# Patient Record
Sex: Female | Born: 1970 | Race: White | Hispanic: No | Marital: Married | Smoking: Current every day smoker
Health system: Southern US, Community
[De-identification: ages and names within clinical notes are randomized; demographics above are authoritative.]

## PROBLEM LIST (undated history)

## (undated) DIAGNOSIS — G35D Multiple sclerosis, unspecified: Secondary | ICD-10-CM

## (undated) DIAGNOSIS — R809 Proteinuria, unspecified: Secondary | ICD-10-CM

## (undated) DIAGNOSIS — M1990 Unspecified osteoarthritis, unspecified site: Secondary | ICD-10-CM

## (undated) DIAGNOSIS — J189 Pneumonia, unspecified organism: Secondary | ICD-10-CM

## (undated) DIAGNOSIS — G250 Essential tremor: Secondary | ICD-10-CM

## (undated) DIAGNOSIS — O24419 Gestational diabetes mellitus in pregnancy, unspecified control: Secondary | ICD-10-CM

## (undated) DIAGNOSIS — I10 Essential (primary) hypertension: Secondary | ICD-10-CM

## (undated) DIAGNOSIS — E049 Nontoxic goiter, unspecified: Secondary | ICD-10-CM

## (undated) DIAGNOSIS — G35 Multiple sclerosis: Secondary | ICD-10-CM

## (undated) DIAGNOSIS — Z8489 Family history of other specified conditions: Secondary | ICD-10-CM

## (undated) DIAGNOSIS — IMO0001 Reserved for inherently not codable concepts without codable children: Secondary | ICD-10-CM

## (undated) HISTORY — DX: Multiple sclerosis, unspecified: G35.D

## (undated) HISTORY — DX: Reserved for inherently not codable concepts without codable children: IMO0001

## (undated) HISTORY — PX: WISDOM TOOTH EXTRACTION: SHX21

## (undated) HISTORY — DX: Gestational diabetes mellitus in pregnancy, unspecified control: O24.419

## (undated) HISTORY — DX: Essential (primary) hypertension: I10

## (undated) HISTORY — DX: Unspecified osteoarthritis, unspecified site: M19.90

## (undated) HISTORY — DX: Essential tremor: G25.0

## (undated) HISTORY — DX: Multiple sclerosis: G35

## (undated) HISTORY — DX: Proteinuria, unspecified: R80.9

## (undated) HISTORY — PX: COLONOSCOPY: SHX174

---
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
## 1989-01-29 HISTORY — PX: TONSILLECTOMY: SHX5217

## 2012-08-05 ENCOUNTER — Ambulatory Visit (INDEPENDENT_AMBULATORY_CARE_PROVIDER_SITE_OTHER): Payer: BC Managed Care – PPO | Admitting: Family Medicine

## 2012-08-05 ENCOUNTER — Encounter: Payer: Self-pay | Admitting: Family Medicine

## 2012-08-05 VITALS — BP 120/78 | HR 102 | Temp 98.5°F | Wt 209.8 lb

## 2012-08-05 DIAGNOSIS — R350 Frequency of micturition: Secondary | ICD-10-CM

## 2012-08-05 LAB — POCT URINALYSIS DIPSTICK
Bilirubin, UA: NEGATIVE
Nitrite, UA: NEGATIVE
pH, UA: 6

## 2012-08-05 MED ORDER — NITROFURANTOIN MONOHYD MACRO 100 MG PO CAPS: 100.0000 mg | ORAL_CAPSULE | Freq: Two times a day (BID) | ORAL | Status: DC

## 2012-08-05 NOTE — Patient Instructions (Signed)
Urinary Tract Infection  Urinary tract infections (UTIs) can develop anywhere along your urinary tract. Your urinary tract is your body's drainage system for removing wastes and extra water. Your urinary tract includes two kidneys, two ureters, a bladder, and a urethra. Your kidneys are a pair of bean-shaped organs. Each kidney is about the size of your fist. They are located below your ribs, one on each side of your spine.  CAUSES  Infections are caused by microbes, which are microscopic organisms, including fungi, viruses, and bacteria. These organisms are so small that they can only be seen through a microscope. Bacteria are the microbes that most commonly cause UTIs.  SYMPTOMS   Symptoms of UTIs may vary by age and gender of the patient and by the location of the infection. Symptoms in young women typically include a frequent and intense urge to urinate and a painful, burning feeling in the bladder or urethra during urination. Older women and men are more likely to be tired, shaky, and weak and have muscle aches and abdominal pain. A fever may mean the infection is in your kidneys. Other symptoms of a kidney infection include pain in your back or sides below the ribs, nausea, and vomiting.  DIAGNOSIS  To diagnose a UTI, your caregiver will ask you about your symptoms. Your caregiver also will ask to provide a urine sample. The urine sample will be tested for bacteria and white blood cells. White blood cells are made by your body to help fight infection.  TREATMENT   Typically, UTIs can be treated with medication. Because most UTIs are caused by a bacterial infection, they usually can be treated with the use of antibiotics. The choice of antibiotic and length of treatment depend on your symptoms and the type of bacteria causing your infection.  HOME CARE INSTRUCTIONS   If you were prescribed antibiotics, take them exactly as your caregiver instructs you. Finish the medication even if you feel better after you  have only taken some of the medication.   Drink enough water and fluids to keep your urine clear or pale yellow.   Avoid caffeine, tea, and carbonated beverages. They tend to irritate your bladder.   Empty your bladder often. Avoid holding urine for long periods of time.   Empty your bladder before and after sexual intercourse.   After a bowel movement, women should cleanse from front to back. Use each tissue only once.  SEEK MEDICAL CARE IF:    You have back pain.   You develop a fever.   Your symptoms do not begin to resolve within 3 days.  SEEK IMMEDIATE MEDICAL CARE IF:    You have severe back pain or lower abdominal pain.   You develop chills.   You have nausea or vomiting.   You have continued burning or discomfort with urination.  MAKE SURE YOU:    Understand these instructions.   Will watch your condition.   Will get help right away if you are not doing well or get worse.  Document Released: 10/25/2004 Document Revised: 07/17/2011 Document Reviewed: 02/23/2011  ExitCare Patient Information 2014 [employer].

## 2012-08-05 NOTE — Progress Notes (Signed)
  Subjective:    Maria Graves is a 42 y.o. female who complains of burning with urination, frequency, hematuria and bladder spasms. She has had symptoms for 5 weeks. Patient also complains of back pain--- temp 99. Patient denies congestion, cough, fever, headache, rhinitis, sorethroat, stomach ache and vaginal discharge. Patient does not have a history of recurrent UTI. Patient does not have a history of pyelonephritis.   The following portions of the patient's history were reviewed and updated as appropriate: allergies, current medications, past family history, past medical history, past social history, past surgical history and problem list.  Review of Systems Pertinent items are noted in HPI.    Objective:    BP 120/78  Pulse 102  Temp(Src) 98.5 F (36.9 C) (Oral)  Wt 209 lb 12.8 oz (95.165 kg)  SpO2 96% General appearance: alert, cooperative, appears stated age and no distress Abdomen: soft, non-tender; bowel sounds normal; no masses,  no organomegaly  Laboratory:  Urine dipstick: mod for hemoglobin, 2+ for leukocyte esterase and trace for protein.   Micro exam: not done.    Assessment:    UTI     Plan:    Medications: nitrofurantoin. Maintain adequate hydration. Follow up if symptoms not improving, and as needed.

## 2012-08-06 ENCOUNTER — Ambulatory Visit: Payer: Self-pay | Admitting: Family Medicine

## 2012-08-07 ENCOUNTER — Encounter: Payer: Self-pay | Admitting: Family Medicine

## 2012-08-07 ENCOUNTER — Telehealth: Payer: Self-pay | Admitting: Internal Medicine

## 2012-08-07 ENCOUNTER — Telehealth: Payer: Self-pay | Admitting: Family Medicine

## 2012-08-07 ENCOUNTER — Ambulatory Visit (INDEPENDENT_AMBULATORY_CARE_PROVIDER_SITE_OTHER): Payer: BC Managed Care – PPO | Admitting: Family Medicine

## 2012-08-07 ENCOUNTER — Ambulatory Visit: Payer: Self-pay | Admitting: Family Medicine

## 2012-08-07 VITALS — BP 120/90 | HR 109 | Temp 98.6°F | Wt 200.6 lb

## 2012-08-07 DIAGNOSIS — N10 Acute tubulo-interstitial nephritis: Secondary | ICD-10-CM

## 2012-08-07 DIAGNOSIS — R3915 Urgency of urination: Secondary | ICD-10-CM

## 2012-08-07 LAB — POCT URINALYSIS DIPSTICK
Protein, UA: NEGATIVE
pH, UA: 6

## 2012-08-07 LAB — URINE CULTURE

## 2012-08-07 MED ORDER — CIPROFLOXACIN HCL 500 MG PO TABS: 500.0000 mg | ORAL_TABLET | Freq: Two times a day (BID) | ORAL | Status: DC

## 2012-08-07 NOTE — Telephone Encounter (Signed)
Patient was seen in the office 08/07/2012 by Dr. Beverely Low. Issues addressed. GF/RN

## 2012-08-07 NOTE — Telephone Encounter (Signed)
Patient Information:  Caller Name: Julie-Anne  Phone: [phone]  Patient: Maria Graves, Maria Graves  Gender: Female  DOB: [birth year]  Age: 42 Years  PCP: Lelon Perla.  Pregnant: No  Office Follow Up:  Does the office need to follow up with this patient?: Yes  Instructions For The Office: PLS DISCUSS W/ DR LOWNE IF PT CAN BE SEEN EARLIER THAN 1530, APPT SCHEDULED, DUE TO PTS SYMPTOMS NOT RESPONDING TO MACROBID.  RN Note:  Pt was seen on 7-8 for UTI, Macrobid given, Pt has chills w/ flu like symptoms, dizziness and SOB.  All emergent symptoms ruled out per Urinary female protocol in [facility], see in 4 hrs due to fever in immunocompromised person.  Pt has history of MS.  Appt scheduled at 1530 on 7-10 w/ Dr Laury Axon.  Symptoms  Reason For Call & Symptoms: ER CALL. F/U UTI symptoms  Reviewed Health History In EMR: N/A  Reviewed Medications In EMR: N/A  Reviewed Allergies In EMR: N/A  Reviewed Surgeries / Procedures: N/A  Date of Onset of Symptoms: 08/07/2012  Any Fever: Yes  Fever Taken: Tactile  Fever Time Of Reading: 09:38:39  Fever Last Reading: N/A OB / GYN:  LMP: Unknown  Guideline(s) Used:  No Protocol Available - Sick Adult  Disposition Per Guideline:   Go to Office Now  Reason For Disposition Reached:   Nursing judgment  Advice Given:  N/A  Patient Will Follow Care Advice:  YES  Appointment Scheduled:  08/07/2012 15:30:00 Appointment Scheduled Provider:  Lelon Perla.

## 2012-08-07 NOTE — Telephone Encounter (Signed)
Pt scheduled for 11:15am °

## 2012-08-07 NOTE — Progress Notes (Signed)
  Subjective:    Patient ID: Maria Graves, female    DOB: 03-07-1970, 42 y.o.   MRN: [id number]  HPI UTI- pt seen 2 days ago and started on Macrobid for UTI.  Was treated in June for UTI w/ Cipro by UC.  2 weeks ago had urinary retention x24 hrs and went to ER and was started on Doxy for another UTI- no cath to relieve pressure.  Moved here this week from [facility] and sxs returned this week.  1 hr after 1st dose of macrobid developed chills, sweats, dizzy, body aches, 'flu like sxs', + CVA tenderness.  No known sick contacts.   Review of Systems For ROS see HPI     Objective:   Physical Exam  Vitals reviewed. Constitutional: She is oriented to person, place, and time. She appears well-developed and well-nourished. No distress.  Cardiovascular: Normal rate, regular rhythm and normal heart sounds.   Pulmonary/Chest: Effort normal and breath sounds normal. No respiratory distress. She has no wheezes. She has no rales.  Abdominal: Soft. There is tenderness (suprapubic and CVA tenderness).  Neurological: She is alert and oriented to person, place, and time.          Assessment & Plan:

## 2012-08-07 NOTE — Assessment & Plan Note (Addendum)
New.  Pt now w/ systemic sxs and prelim culture shows >100,000 E Coli.  Stop Macrobid.  Switch to Cipro.  Encouraged fluids.  Refer to urology as this is pt's 3rd UTI and now Pyelo in 6 weeks.  Reviewed supportive care and red flags that should prompt return.  Pt expressed understanding and is in agreement w/ plan.

## 2012-08-07 NOTE — Telephone Encounter (Signed)
Patient states that the antibiotic she was given on Tuesday at her appointment: Macrobid is causing her to be shaky and very thirsty. States "I am now drinking water like it is going out of style." Wants to know what she should do. Please advise.

## 2012-08-07 NOTE — Patient Instructions (Addendum)
Start the Cipro twice daily Drink plenty of fluids REST! Alternate tylenol and ibuprofen for fever and body aches We'll call you with your urology appt Call with any questions or concerns Hang in there!!!

## 2012-09-24 ENCOUNTER — Ambulatory Visit: Payer: Self-pay | Admitting: Internal Medicine

## 2012-10-01 ENCOUNTER — Ambulatory Visit (INDEPENDENT_AMBULATORY_CARE_PROVIDER_SITE_OTHER): Payer: BC Managed Care – PPO | Admitting: Internal Medicine

## 2012-10-01 ENCOUNTER — Encounter: Payer: Self-pay | Admitting: Internal Medicine

## 2012-10-01 VITALS — BP 132/84 | HR 84 | Temp 98.2°F | Ht 67.4 in | Wt 206.0 lb

## 2012-10-01 DIAGNOSIS — N10 Acute tubulo-interstitial nephritis: Secondary | ICD-10-CM

## 2012-10-01 DIAGNOSIS — R911 Solitary pulmonary nodule: Secondary | ICD-10-CM | POA: Insufficient documentation

## 2012-10-01 DIAGNOSIS — Z0000 Encounter for general adult medical examination without abnormal findings: Secondary | ICD-10-CM | POA: Insufficient documentation

## 2012-10-01 DIAGNOSIS — I10 Essential (primary) hypertension: Secondary | ICD-10-CM | POA: Insufficient documentation

## 2012-10-01 DIAGNOSIS — G35 Multiple sclerosis: Secondary | ICD-10-CM

## 2012-10-01 LAB — BASIC METABOLIC PANEL
BUN: 11 mg/dL (ref 6–23)
CO2: 26 mEq/L (ref 19–32)
Chloride: 103 mEq/L (ref 96–112)
Creatinine, Ser: 0.7 mg/dL (ref 0.4–1.2)
Potassium: 3.5 mEq/L (ref 3.5–5.1)

## 2012-10-01 MED ORDER — BACLOFEN 10 MG PO TABS: 10.0000 mg | ORAL_TABLET | Freq: Two times a day (BID) | ORAL | Status: DC | PRN

## 2012-10-01 MED ORDER — LISINOPRIL 10 MG PO TABS: 10.0000 mg | ORAL_TABLET | Freq: Every day | ORAL | Status: DC

## 2012-10-01 NOTE — Patient Instructions (Addendum)
Next visit in 5 months  for a physical exam  Please make an appointment before you leave the office today (or call few weeks in advance) --- Get records from previous MD: Most recent labs, x-rays, EKGs, Pap smears, colonoscopy if available, neurology notes.

## 2012-10-01 NOTE — Assessment & Plan Note (Signed)
Patient is concerned about recent UTI. The patient denies history of recurrent UTIs, CT of the abdomen and pelvis showed a normal urinary tract. She did have a single episode of urinary retention but is now asymptomatic, urology did not recommend further eval at this time. For now I recommend observation, if she develops more UTIs, she will need further eval including urodynamics.

## 2012-10-01 NOTE — Assessment & Plan Note (Signed)
42 year old lady with no family history of lung cancer, former heavy smoker with 5 mm LLL nodule. I explained the patient that this is a very common finding and the right thing to do is repeat a CT, we agreed to do that in 6 months

## 2012-10-01 NOTE — Assessment & Plan Note (Signed)
Needs a referral to a local  gynecologist, we'll arrange

## 2012-10-01 NOTE — Assessment & Plan Note (Signed)
Needs to establish with a new neurologist, will arrange. Refill baclofen

## 2012-10-01 NOTE — Assessment & Plan Note (Signed)
Seems well controlled. Refill her lisinopril, labs

## 2012-10-01 NOTE — Progress Notes (Signed)
Subjective:    Patient ID: Maria Graves, female    DOB: 19-Feb-1970, 42 y.o.   MRN: [id number]  HPI New patient to me, likes to discuss her recent UTI and CT results.  Several weeks ago while in [place], she was diagnosed with UTI yeast infection, she was prescribed Cipro. She did have a single episode of urinary retention for a day or 2 and ended up in the emergency room. Subsequently she moved to [facility], she was diagnosed with a UTI via a urine culture here in our office and was prescribed Macrobid. She did not respond to macrobid , was switched to Cipro and eventually referred  to urology. Urology saw her,Rx a  CT of the abdomen and pelvis which was essentially negative except for a 5 mm LLL nodule. They believe that she felt unwell after Macrobid only because the infection did not respond to the antibiotic not because a s/e. They don't think the single episode of urinary retention was something that needed further eval. The repeated a urine culture and it came back negative.  Past Medical History  Diagnosis Date  . MS (multiple sclerosis)   . Gestational diabetes   . HTN (hypertension)   . Essential tremor    Past Surgical History  Procedure Laterality Date  . Tonsillectomy  1991   History   Social History  . Marital Status: Married    Spouse Name: N/A    Number of Children: 2  . Years of Education: N/A   Occupational History  . 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 Smoking status: Former Games developer  . Smokeless tobacco: Never Used     Comment: 1 ppd x 20 year, quit 2010  . Alcohol Use: Yes     Comment: Socially  . Drug Use: No  . Sexual Activity: Not on file   Other Topics Concern  . Not on file   Social History Narrative   Moved from [place] July 2014   G3P2   Family History  Problem Relation Age of Onset  . Arthritis Mother   . Alcohol abuse Mother   . Hypertension Brother   . Heart disease Paternal Grandfather   . Stroke Paternal  Grandmother   . Diabetes Father     Borderline  . Diabetes Maternal Grandfather   . Sudden death Other     uncle, GF  . Heart attack Paternal Grandfather   . Clotting disorder Father      Review of Systems At this point she is feeling well. No fever or chills No Abdominal pain, nausea, vomiting, diarrhea. No cough, chest congestion, chest pain. No weight loss or hemoptysis.    Objective:   Physical Exam BP 132/84  Pulse 84  Temp(Src) 98.2 F (36.8 C)  Ht 5' 7.4" (1.712 m)  Wt 206 lb (93.441 kg)  BMI 31.88 kg/m2  SpO2 97%  General -- alert, well-developed, NAD.  Neck --no thyromegaly , no LAD  Lungs -- normal respiratory effort, no intercostal retractions, no accessory muscle use, and normal breath sounds.  Heart-- normal rate, regular rhythm, no murmur.  Abdomen-- Not distended, good bowel sounds,soft, non-tender. No CVA tenderness   Extremities-- no pretibial edema bilaterally  Neurologic-- alert & oriented X3. Speech, gait normal. Psych-- Cognition and judgment appear intact. Alert and cooperative with normal attention span and concentration. not anxious appearing and not depressed appearing.      Assessment & Plan:

## 2012-10-02 ENCOUNTER — Encounter: Payer: Self-pay | Admitting: Internal Medicine

## 2012-10-03 ENCOUNTER — Encounter: Payer: Self-pay | Admitting: *Deleted

## 2012-10-22 ENCOUNTER — Other Ambulatory Visit: Payer: Self-pay

## 2012-10-22 DIAGNOSIS — Z1231 Encounter for screening mammogram for malignant neoplasm of breast: Secondary | ICD-10-CM

## 2012-10-29 LAB — HM PAP SMEAR

## 2012-11-11 ENCOUNTER — Ambulatory Visit
Admission: RE | Admit: 2012-11-11 | Discharge: 2012-11-11 | Disposition: A | Discharge status: Home / Self Care | Payer: BC Managed Care – PPO | Source: Ambulatory Visit

## 2012-11-11 DIAGNOSIS — Z1231 Encounter for screening mammogram for malignant neoplasm of breast: Secondary | ICD-10-CM

## 2013-03-06 ENCOUNTER — Telehealth: Payer: Self-pay

## 2013-03-09 NOTE — Telephone Encounter (Signed)
Medication and allergies:  Reviewed and updated  90 day supply/mail order: na Local pharmacy: [facility]   Immunizations due:  Declines flu vaccine  A/P:   No changes to FH, PSH or Personal Hx Flu vaccine--declines Tdap--reaction to last one given- Pap--2014--nml MMG--2014--nml  To Discuss with Provider: Questions is there is any part of the Tdap that she could receive? Has received in parts before. Still concerned about pulmonary nodule--repeat CT

## 2013-03-10 ENCOUNTER — Ambulatory Visit (INDEPENDENT_AMBULATORY_CARE_PROVIDER_SITE_OTHER): Payer: 59 | Admitting: Internal Medicine

## 2013-03-10 ENCOUNTER — Encounter: Payer: Self-pay | Admitting: Internal Medicine

## 2013-03-10 VITALS — BP 151/94 | HR 87 | Temp 98.0°F | Ht 67.3 in | Wt 219.0 lb

## 2013-03-10 DIAGNOSIS — G35 Multiple sclerosis: Secondary | ICD-10-CM

## 2013-03-10 DIAGNOSIS — Z0000 Encounter for general adult medical examination without abnormal findings: Secondary | ICD-10-CM

## 2013-03-10 DIAGNOSIS — I10 Essential (primary) hypertension: Secondary | ICD-10-CM

## 2013-03-10 LAB — CBC WITH DIFFERENTIAL/PLATELET
Basophils Absolute: 0.1 10*3/uL (ref 0.0–0.1)
Basophils Relative: 0.8 % (ref 0.0–3.0)
EOS PCT: 2.5 % (ref 0.0–5.0)
Eosinophils Absolute: 0.2 10*3/uL (ref 0.0–0.7)
HEMATOCRIT: 43 % (ref 36.0–46.0)
HEMOGLOBIN: 13.9 g/dL (ref 12.0–15.0)
LYMPHS ABS: 2.4 10*3/uL (ref 0.7–4.0)
LYMPHS PCT: 31.4 % (ref 12.0–46.0)
MCHC: 32.3 g/dL (ref 30.0–36.0)
MCV: 86.3 fl (ref 78.0–100.0)
MONOS PCT: 4.8 % (ref 3.0–12.0)
Monocytes Absolute: 0.4 10*3/uL (ref 0.1–1.0)
Neutro Abs: 4.6 10*3/uL (ref 1.4–7.7)
Neutrophils Relative %: 60.5 % (ref 43.0–77.0)
PLATELETS: 267 10*3/uL (ref 150.0–400.0)
RBC: 4.98 Mil/uL (ref 3.87–5.11)
RDW: 14.1 % (ref 11.5–14.6)
WBC: 7.6 10*3/uL (ref 4.5–10.5)

## 2013-03-10 LAB — LIPID PANEL
Cholesterol: 170 mg/dL (ref 0–200)
HDL: 52 mg/dL (ref >39.00)
LDL Cholesterol: 95 mg/dL (ref 0–99)
Total CHOL/HDL Ratio: 3
Triglycerides: 113 mg/dL (ref 0.0–149.0)
VLDL: 22.6 mg/dL (ref 0.0–40.0)

## 2013-03-10 LAB — COMPREHENSIVE METABOLIC PANEL
ALT: 16 U/L (ref 0–35)
AST: 13 U/L (ref 0–37)
Albumin: 4.1 g/dL (ref 3.5–5.2)
Alkaline Phosphatase: 48 U/L (ref 39–117)
BUN: 13 mg/dL (ref 6–23)
CALCIUM: 9 mg/dL (ref 8.4–10.5)
CHLORIDE: 104 meq/L (ref 96–112)
CO2: 27 meq/L (ref 19–32)
Creatinine, Ser: 0.7 mg/dL (ref 0.4–1.2)
GFR: 96.2 mL/min (ref >60.00)
Glucose, Bld: 95 mg/dL (ref 70–99)
Potassium: 3.6 mEq/L (ref 3.5–5.1)
Sodium: 139 mEq/L (ref 135–145)
Total Bilirubin: 0.5 mg/dL (ref 0.3–1.2)
Total Protein: 7.4 g/dL (ref 6.0–8.3)

## 2013-03-10 LAB — TSH: TSH: 1.2 u[IU]/mL (ref 0.35–5.50)

## 2013-03-10 NOTE — Assessment & Plan Note (Signed)
Good compliance with lisinopril, BP today slightly elevated, no  ambulatory BPs. Plan: Monitor BPs

## 2013-03-10 NOTE — Progress Notes (Signed)
   Subjective:    Patient ID: Maria Graves, female    DOB: 1970/12/30, 43 y.o.   MRN: [id number]  DOS:  03/10/2013 CPX    Past Medical History  Diagnosis Date  . MS (multiple sclerosis)   . Gestational diabetes   . HTN (hypertension)   . Essential tremor   . Contraception     husband vasectomy    Past Surgical History  Procedure Laterality Date  . Tonsillectomy  1991    History   Social History  . Marital Status: Married    Spouse Name: N/A    Number of Children: 2  . Years of Education: N/A   Occupational History  . 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 Smoking status: Former Games developer  . Smokeless tobacco: Never Used     Comment: 1 ppd x 20 year, quit 2010  . Alcohol Use: Yes     Comment: Socially  . Drug Use: No  . Sexual Activity: Not on file   Other Topics Concern  . Not on file   Social History Narrative   Moved from [place] July 2014   G3P2   Family History  Problem Relation Age of Onset  . Arthritis Mother   . Alcohol abuse Mother   . Hypertension Brother   . Heart disease Paternal Grandfather   . Stroke Paternal Grandmother   . Diabetes Father     Borderline  . Diabetes Maternal Grandfather   . Sudden death Other     uncle, GF  . Heart attack Paternal Grandfather   . Clotting disorder Father   . Colon cancer Neg Hx   . Breast cancer Neg Hx      ROS Diet-- healthy most of the time  Exercise-- 30 min x 5/week No  CP, SOB  Occasionally has pain to touch @ L  flank x 2 years Denies  nausea, vomiting diarrhea Denies  blood in the stools No GERD  Sx. occ chokes w/ fluids, several times a week occ odynophagia No dysuria, gross hematuria, difficulty urinating   No anxiety, depression      Objective:   Physical Exam BP 151/94  Pulse 87  Temp(Src) 98 F (36.7 C)  Ht 5' 7.3" (1.709 m)  Wt 219 lb (99.338 kg)  BMI 34.01 kg/m2  SpO2 97% General -- alert, well-developed, NAD.  Neck --no thyromegaly ,  normal carotid pulse   Lungs -- normal respiratory effort, no intercostal retractions, no accessory muscle use, and normal breath sounds.  Heart-- normal rate, regular rhythm, no murmur.  Abdomen-- Not distended, good bowel sounds,soft, non-tender. Back-- slt TTP at the mid-L back Extremities-- no pretibial edema bilaterally  Neurologic--  alert & oriented X3. Speech normal, gait normal, strength normal in all extremities.  Psych-- Cognition and judgment appear intact. Cooperative with normal attention span and concentration. No anxious or depressed appearing.      Assessment & Plan:

## 2013-03-10 NOTE — Assessment & Plan Note (Addendum)
Td -- intolerant , ++ swelling  Flu vaccine--declines   Female care: Pap--2014--nml  MMG--2014--nml Was told at some point she had goiter? Exam today (-): observation Never had a cscope  Doing well with diet and exercise Labs, EKG nsr

## 2013-03-10 NOTE — Assessment & Plan Note (Signed)
Occasional choking when she drinks fluids, related to MS? Otherwise she is doing great. Likes to see a MS specialist, will refer to [facility]

## 2013-03-10 NOTE — Progress Notes (Signed)
Pre visit review using our clinic review tool, if applicable. No additional management support is needed unless otherwise documented below in the visit note. 

## 2013-03-10 NOTE — Patient Instructions (Signed)
Get your blood work before you leave   Next visit is for a physical exam in 1 year, fasting (as long as your BP is ok) Please make an appointment    Check the  blood pressure 2 or 3 times a month   be sure it is between 110/60 and 140/85. Ideal blood pressure is 120/80. If it is consistently higher or lower, let me know

## 2013-03-11 ENCOUNTER — Telehealth: Payer: Self-pay | Admitting: Internal Medicine

## 2013-03-11 NOTE — Telephone Encounter (Signed)
Relevant patient education assigned to patient using Emmi. ° °

## 2013-03-12 ENCOUNTER — Encounter: Payer: Self-pay | Admitting: *Deleted

## 2013-04-26 ENCOUNTER — Other Ambulatory Visit: Payer: 59 | Admitting: Internal Medicine

## 2013-04-26 DIAGNOSIS — R911 Solitary pulmonary nodule: Secondary | ICD-10-CM

## 2013-04-30 ENCOUNTER — Other Ambulatory Visit (HOSPITAL_BASED_OUTPATIENT_CLINIC_OR_DEPARTMENT_OTHER): Payer: 59

## 2013-05-13 ENCOUNTER — Ambulatory Visit (HOSPITAL_BASED_OUTPATIENT_CLINIC_OR_DEPARTMENT_OTHER)
Admission: RE | Admit: 2013-05-13 | Discharge: 2013-05-13 | Disposition: A | Discharge status: Home / Self Care | Payer: 59 | Source: Ambulatory Visit | Attending: Internal Medicine | Admitting: Internal Medicine

## 2013-05-13 DIAGNOSIS — R911 Solitary pulmonary nodule: Secondary | ICD-10-CM | POA: Insufficient documentation

## 2013-06-16 ENCOUNTER — Telehealth: Payer: Self-pay | Admitting: Internal Medicine

## 2013-06-16 NOTE — Telephone Encounter (Signed)
Caller name:Kambrey Atienza Relation to JY:NWGNFAOpt:patient Call back number: [phone] Pharmacy:  Reason for call: patient called and would like for her recent lab work to be mailed to her home. Please advise.

## 2013-06-16 NOTE — Telephone Encounter (Signed)
Copy of lab results mailed to patient.

## 2013-07-28 ENCOUNTER — Ambulatory Visit (INDEPENDENT_AMBULATORY_CARE_PROVIDER_SITE_OTHER): Payer: 59 | Admitting: Family Medicine

## 2013-07-28 ENCOUNTER — Encounter: Payer: Self-pay | Admitting: Family Medicine

## 2013-07-28 VITALS — BP 132/92 | HR 92 | Temp 97.9°F | Wt 217.0 lb

## 2013-07-28 DIAGNOSIS — R102 Pelvic and perineal pain: Secondary | ICD-10-CM

## 2013-07-28 DIAGNOSIS — N949 Unspecified condition associated with female genital organs and menstrual cycle: Secondary | ICD-10-CM

## 2013-07-28 LAB — POCT URINALYSIS DIPSTICK
Bilirubin, UA: NEGATIVE
Glucose, UA: NEGATIVE
KETONES UA: NEGATIVE
LEUKOCYTES UA: NEGATIVE
NITRITE UA: NEGATIVE
PROTEIN UA: NEGATIVE
Spec Grav, UA: <=1.005
UROBILINOGEN UA: 0.2
pH, UA: 5

## 2013-07-28 MED ORDER — FLUCONAZOLE 150 MG PO TABS: ORAL_TABLET | ORAL | Status: DC

## 2013-07-28 NOTE — Progress Notes (Signed)
   Subjective:    Patient ID: Maria Graves, female    DOB: 02-14-1970, 43 y.o.   MRN: [id number]  HPI Pt here c/o pelvic pain and d/c with frequency.  No odor.  No back pain.    Review of Systems    as above  Objective:   Physical Exam  BP 132/92  Pulse 92  Temp(Src) 97.9 F (36.6 C) (Oral)  Wt 217 lb (98.431 kg)  SpO2 98%  LMP 07/07/2013 General appearance: alert, cooperative, appears stated age and no distress Pelvic-- refused by pt      Assessment & Plan:  1. Pelvic pain in female Pt refused to have pelvic exam. She said she has had d/c several times before and her dr always called in diflucan in past.  We explained that with her urine being neg and having d/c and pelvic pain it was best to do an exam.  She refused and said she had somewhere to be and " wasn't getting into all of that.  " - POCT Urinalysis Dipstick - fluconazole (DIFLUCAN) 150 MG tablet; 1 po qd x 1 , may repeat in 3 days prn  Dispense: 2 tablet; Refill: 0

## 2013-07-28 NOTE — Patient Instructions (Signed)
Pelvic Pain, Female Female pelvic pain can be caused by many different things and start from a variety of places. Pelvic pain refers to pain that is located in the lower half of the abdomen and between your hips. The pain may occur over a short period of time (acute) or may be reoccurring (chronic). The cause of pelvic pain may be related to disorders affecting the female reproductive organs (gynecologic), but it may also be related to the bladder, kidney stones, an intestinal complication, or muscle or skeletal problems. Getting help right away for pelvic pain is important, especially if there has been severe, sharp, or a sudden onset of unusual pain. It is also important to get help right away because some types of pelvic pain can be life threatening.  CAUSES  Below are only some of the causes of pelvic pain. The causes of pelvic pain can be in one of several categories.   Gynecologic.  Pelvic inflammatory disease.  Sexually transmitted infection.  Ovarian cyst or a twisted ovarian ligament (ovarian torsion).  Uterine lining that grows outside the uterus (endometriosis).  Fibroids, cysts, or tumors.  Ovulation.  Pregnancy.  Pregnancy that occurs outside the uterus (ectopic pregnancy).  Miscarriage.  Labor.  Abruption of the placenta or ruptured uterus.  Infection.  Uterine infection (endometritis).  Bladder infection.  Diverticulitis.  Miscarriage related to a uterine infection (septic abortion).  Bladder.  Inflammation of the bladder (cystitis).  Kidney stone(s).  Gastrointenstinal.  Constipation.  Diverticulitis.  Neurologic.  Trauma.  Feeling pelvic pain because of mental or emotional causes (psychosomatic).  Cancers of the bowel or pelvis. EVALUATION  Your caregiver will want to take a careful history of your concerns. This includes recent changes in your health, a careful gynecologic history of your periods (menses), and a sexual history. Obtaining  your family history and medical history is also important. Your caregiver may suggest a pelvic exam. A pelvic exam will help identify the location and severity of the pain. It also helps in the evaluation of which organ system may be involved. In order to identify the cause of the pelvic pain and be properly treated, your caregiver may order tests. These tests may include:   A pregnancy test.  Pelvic ultrasonography.  An X-ray exam of the abdomen.  A urinalysis or evaluation of vaginal discharge.  Blood tests. HOME CARE INSTRUCTIONS   Only take over-the-counter or prescription medicines for pain, discomfort, or fever as directed by your caregiver.   Rest as directed by your caregiver.   Eat a balanced diet.   Drink enough fluids to make your urine clear or pale yellow, or as directed.   Avoid sexual intercourse if it causes pain.   Apply warm or cold compresses to the lower abdomen depending on which one helps the pain.   Avoid stressful situations.   Keep a journal of your pelvic pain. Write down when it started, where the pain is located, and if there are things that seem to be associated with the pain, such as food or your menstrual cycle.  Follow up with your caregiver as directed.  SEEK MEDICAL CARE IF:  Your medicine does not help your pain.  You have abnormal vaginal discharge. SEEK IMMEDIATE MEDICAL CARE IF:   You have heavy bleeding from the vagina.   Your pelvic pain increases.   You feel lightheaded or faint.   You have chills.   You have pain with urination or blood in your urine.   You have uncontrolled   diarrhea or vomiting.   You have a fever or persistent symptoms for more than 3 days.  You have a fever and your symptoms suddenly get worse.   You are being physically or sexually abused.  MAKE SURE YOU:  Understand these instructions.  Will watch your condition.  Will get help if you are not doing well or get worse. Document  Released: 12/13/2003 Document Revised: 07/17/2011 Document Reviewed: 05/07/2011 ExitCare Patient Information 2015 [employer]. This information is not intended to replace advice given to you by your health care provider. Make sure you discuss any questions you have with your health care provider.  

## 2013-07-28 NOTE — Progress Notes (Signed)
Pre visit review using our clinic review tool, if applicable. No additional management support is needed unless otherwise documented below in the visit note. 

## 2013-10-27 ENCOUNTER — Other Ambulatory Visit: Payer: Self-pay

## 2013-10-27 MED ORDER — LISINOPRIL 10 MG PO TABS: 10.0000 mg | ORAL_TABLET | Freq: Every day | ORAL | Status: DC

## 2013-12-25 ENCOUNTER — Other Ambulatory Visit: Payer: Self-pay | Admitting: Internal Medicine

## 2014-02-26 ENCOUNTER — Other Ambulatory Visit: Payer: Self-pay | Admitting: Internal Medicine

## 2014-03-25 ENCOUNTER — Other Ambulatory Visit: Payer: Self-pay | Admitting: Internal Medicine

## 2014-05-22 ENCOUNTER — Telehealth: Payer: Self-pay | Admitting: Internal Medicine

## 2014-05-22 DIAGNOSIS — R911 Solitary pulmonary nodule: Secondary | ICD-10-CM

## 2014-05-22 NOTE — Telephone Encounter (Signed)
Advise pt: Due for repeated CT chest, order entered Due for a pcx, please arrange

## 2014-05-24 ENCOUNTER — Telehealth: Payer: Self-pay | Admitting: Internal Medicine

## 2014-05-24 ENCOUNTER — Other Ambulatory Visit: Payer: Self-pay

## 2014-05-24 NOTE — Telephone Encounter (Signed)
Letter printed and mailed to Pt informing her she is overdue for CPE to call office at her earliest convenience to schedule an appt.

## 2014-05-24 NOTE — Telephone Encounter (Signed)
Relation to pt: self  Call back number: [phone] Pharmacy: [id number] [place], [place] - [address] [phone] (Phone) [phone] (Fax)         Reason for call:  Pt requesting a refill lisinopril (PRINIVIL,ZESTRIL) 10 MG tablet

## 2014-05-24 NOTE — Telephone Encounter (Signed)
Just received refill request from pharmacy. Approved for # 30 tablets until seen later this week.

## 2014-05-25 ENCOUNTER — Other Ambulatory Visit: Payer: Self-pay | Admitting: Internal Medicine

## 2014-05-25 NOTE — Telephone Encounter (Signed)
Medication declined because medication was refilled 05/24/14.

## 2014-05-27 ENCOUNTER — Other Ambulatory Visit: Payer: Self-pay

## 2014-05-27 ENCOUNTER — Ambulatory Visit (HOSPITAL_BASED_OUTPATIENT_CLINIC_OR_DEPARTMENT_OTHER)
Admission: RE | Admit: 2014-05-27 | Discharge: 2014-05-27 | Disposition: A | Discharge status: Home / Self Care | Payer: 59 | Source: Ambulatory Visit | Attending: Internal Medicine | Admitting: Internal Medicine

## 2014-05-27 DIAGNOSIS — R911 Solitary pulmonary nodule: Secondary | ICD-10-CM | POA: Diagnosis not present

## 2014-05-28 ENCOUNTER — Encounter: Payer: Self-pay | Admitting: Internal Medicine

## 2014-05-28 ENCOUNTER — Ambulatory Visit (INDEPENDENT_AMBULATORY_CARE_PROVIDER_SITE_OTHER): Payer: 59 | Admitting: Internal Medicine

## 2014-05-28 VITALS — BP 138/84 | HR 75 | Temp 98.0°F | Ht 67.0 in | Wt 215.4 lb

## 2014-05-28 DIAGNOSIS — R911 Solitary pulmonary nodule: Secondary | ICD-10-CM

## 2014-05-28 DIAGNOSIS — Z0000 Encounter for general adult medical examination without abnormal findings: Secondary | ICD-10-CM

## 2014-05-28 DIAGNOSIS — G35 Multiple sclerosis: Secondary | ICD-10-CM

## 2014-05-28 DIAGNOSIS — R350 Frequency of micturition: Secondary | ICD-10-CM

## 2014-05-28 DIAGNOSIS — I10 Essential (primary) hypertension: Secondary | ICD-10-CM

## 2014-05-28 NOTE — Progress Notes (Signed)
Pre visit review using our clinic review tool, if applicable. No additional management support is needed unless otherwise documented below in the visit note. 

## 2014-05-28 NOTE — Patient Instructions (Signed)
Get your blood work before you leave   Check the  blood pressure monthly  Be sure your blood pressure is between 110/65 and  145/85.  if it is consistently higher or lower, let me know   Come back to the office in 1 year  for a physical exam  Please schedule an appointment at the front desk    Come back fasting       

## 2014-05-28 NOTE — Progress Notes (Signed)
Subjective:    Patient ID: Maria Graves, female    DOB: 1970-02-13, 44 y.o.   MRN: [id number]  DOS:  05/28/2014 Type of visit - description : cpx Interval history: Doing well in general, likes to discuss her recent CT of the chest   Review of Systems Constitutional: No fever, chills. No unexplained wt changes. No unusual sweats HEENT: No dental problems, ear discharge, facial swelling, voice changes. No eye discharge, redness or intolerance to light Respiratory: No wheezing or difficulty breathing. No cough , mucus production Cardiovascular: No CP, occ leg swelling at the end of the day, no palpitations GI: no nausea, vomiting, diarrhea or abdominal pain.  No blood in the stools. No dysphagia   Endocrine: No polyphagia, polyuria or polydipsia GU: Chronic on and off of urgency and difficulty emptying her bladder. No dysuria, gross hematuria. No urinary urgency or frequency. Musculoskeletal: No joint swellings or unusual aches or pains Skin: No change in the color of the skin, palor or rash Allergic, immunologic: + environmental allergies , good response to OTC antihistamine. No food allergies Neurological: No dizziness or syncope. No headaches. No diplopia, slurred speech, motor deficits, facial numbness Hematological: No enlarged lymph nodes, easy bruising or bleeding Psychiatry: No suicidal ideas, hallucinations, behavior problems or confusion. No unusual/severe anxiety or depression.     Past Medical History  Diagnosis Date  . MS (multiple sclerosis)   . Gestational diabetes   . HTN (hypertension)   . Essential tremor   . Contraception     husband vasectomy  . Proteinuria     Reports mild, chronic proteinuria    Past Surgical History  Procedure Laterality Date  . Tonsillectomy  1991    History   Social History  . Marital Status: Married    Spouse Name: N/A  . Number of Children: 2  . Years of Education: N/A   Occupational History  . 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 Smoking status: Former Games developer  . Smokeless tobacco: Never Used     Comment: 1 ppd x 20 year, quit 2010  . Alcohol Use: Yes     Comment: Socially  . Drug Use: No  . Sexual Activity: Not on file   Other Topics Concern  . Not on file   Social History Narrative   Moved from [place] July 2014   G3P2     Family History  Problem Relation Age of Onset  . Arthritis Mother   . Alcohol abuse Mother   . Hypertension Brother   . Heart disease Paternal Grandfather   . Stroke Paternal Grandmother   . Diabetes Father     Borderline  . Diabetes Maternal Grandfather   . Sudden death Other     uncle, GF  . Heart attack Paternal Grandfather   . Clotting disorder Father   . Colon cancer Neg Hx   . Breast cancer Neg Hx        Medication List       This list is accurate as of: 05/28/14 11:59 PM.  Always use your most recent med list.               CLARITIN PO  Take 1 tablet by mouth daily as needed. [facility] Brand OTC     lisinopril 10 MG tablet  Commonly known as:  PRINIVIL,ZESTRIL  Take 1 tablet (10 mg total) by mouth daily.     PROBIOTIC DAILY  PO  Take 1 tablet by mouth daily.           Objective:   Physical Exam BP 138/84 mmHg  Pulse 75  Temp(Src) 98 F (36.7 C) (Oral)  Ht 5\' 7"  (1.702 m)  Wt 215 lb 7 oz (97.722 kg)  BMI 33.73 kg/m2  SpO2 96%  LMP 05/27/2014 General:   Well developed, well nourished . NAD.  Neck:  Full range of motion. Supple. No  thyromegaly , normal carotid pulse HEENT:  Normocephalic . Face symmetric, atraumatic Lungs:  CTA B Normal respiratory effort, no intercostal retractions, no accessory muscle use. Heart: RRR,  no murmur.  Abdomen:  Not distended, soft, non-tender. No rebound or rigidity. No mass,organomegaly Muscle skeletal: no pretibial edema bilaterally  Skin: Exposed areas without rash. Not pale. Not jaundice Neurologic:  alert & oriented X3.  Speech normal, gait appropriate  for age and unassisted Strength symmetric and appropriate for age.  Psych: Cognition and judgment appear intact.  Cooperative with normal attention span and concentration.  Behavior appropriate. No anxious or depressed appearing.        Assessment & Plan:

## 2014-05-28 NOTE — Assessment & Plan Note (Addendum)
Recent CT show stability, radiology recommended 1 additional CT. Case was discussed with the patient who is in agreement. She reports possible exposure to asbestosis 5 years ago, wonders if there is any relationship between the exposure and CT.

## 2014-05-28 NOTE — Assessment & Plan Note (Addendum)
Td -- intolerant , ++ swelling     Female care per Gyn Dr Laurie Panda Pap--2014--nml  MMG--2014--nml Due for a visit. Pt will call    Never had a cscope   Doing ok with diet and exercise -walks 30 min x 3/week  Labs   Other issues: Occasional edema, recommend low salt diet, occasional urinary symptoms, will check a UA and a urine culture. (Patient reports chronic mild proteinuria)

## 2014-05-28 NOTE — Assessment & Plan Note (Signed)
Good compliance of medication, normal ambulatory BPs, no change

## 2014-05-28 NOTE — Assessment & Plan Note (Signed)
Under the care of neuro @  [facility]

## 2014-05-31 ENCOUNTER — Other Ambulatory Visit (INDEPENDENT_AMBULATORY_CARE_PROVIDER_SITE_OTHER): Payer: 59

## 2014-05-31 DIAGNOSIS — R350 Frequency of micturition: Secondary | ICD-10-CM | POA: Diagnosis not present

## 2014-05-31 DIAGNOSIS — Z0000 Encounter for general adult medical examination without abnormal findings: Secondary | ICD-10-CM | POA: Diagnosis not present

## 2014-05-31 LAB — CBC WITH DIFFERENTIAL/PLATELET
BASOS ABS: 0.1 10*3/uL (ref 0.0–0.1)
Basophils Relative: 0.7 % (ref 0.0–3.0)
Eosinophils Absolute: 0.2 10*3/uL (ref 0.0–0.7)
Eosinophils Relative: 2.7 % (ref 0.0–5.0)
HCT: 42.9 % (ref 36.0–46.0)
HEMOGLOBIN: 14.3 g/dL (ref 12.0–15.0)
Lymphocytes Relative: 30.9 % (ref 12.0–46.0)
Lymphs Abs: 2.7 10*3/uL (ref 0.7–4.0)
MCHC: 33.3 g/dL (ref 30.0–36.0)
MCV: 82.6 fl (ref 78.0–100.0)
MONO ABS: 0.6 10*3/uL (ref 0.1–1.0)
Monocytes Relative: 7 % (ref 3.0–12.0)
NEUTROS PCT: 58.7 % (ref 43.0–77.0)
Neutro Abs: 5.1 10*3/uL (ref 1.4–7.7)
PLATELETS: 279 10*3/uL (ref 150.0–400.0)
RBC: 5.2 Mil/uL — ABNORMAL HIGH (ref 3.87–5.11)
RDW: 13.7 % (ref 11.5–15.5)
WBC: 8.7 10*3/uL (ref 4.0–10.5)

## 2014-05-31 LAB — TSH: TSH: 1.2 u[IU]/mL (ref 0.35–4.50)

## 2014-05-31 LAB — LIPID PANEL
Cholesterol: 189 mg/dL (ref 0–200)
HDL: 47.2 mg/dL (ref >39.00)
LDL CALC: 112 mg/dL — AB (ref 0–99)
NonHDL: 141.8
Total CHOL/HDL Ratio: 4
Triglycerides: 151 mg/dL — ABNORMAL HIGH (ref 0.0–149.0)
VLDL: 30.2 mg/dL (ref 0.0–40.0)

## 2014-05-31 LAB — URINALYSIS, ROUTINE W REFLEX MICROSCOPIC
BILIRUBIN URINE: NEGATIVE
Hgb urine dipstick: NEGATIVE
KETONES UR: NEGATIVE
Leukocytes, UA: NEGATIVE
NITRITE: NEGATIVE
PH: 7.5 (ref 5.0–8.0)
RBC / HPF: NONE SEEN (ref 0–?)
SPECIFIC GRAVITY, URINE: 1.015 (ref 1.000–1.030)
TOTAL PROTEIN, URINE-UPE24: NEGATIVE
UROBILINOGEN UA: 0.2 (ref 0.0–1.0)
Urine Glucose: NEGATIVE
WBC, UA: NONE SEEN (ref 0–?)

## 2014-05-31 LAB — COMPREHENSIVE METABOLIC PANEL
ALK PHOS: 55 U/L (ref 39–117)
ALT: 23 U/L (ref 0–35)
AST: 13 U/L (ref 0–37)
Albumin: 4.1 g/dL (ref 3.5–5.2)
BILIRUBIN TOTAL: 0.5 mg/dL (ref 0.2–1.2)
BUN: 13 mg/dL (ref 6–23)
CALCIUM: 9.4 mg/dL (ref 8.4–10.5)
CO2: 27 meq/L (ref 19–32)
Chloride: 101 mEq/L (ref 96–112)
Creatinine, Ser: 0.82 mg/dL (ref 0.40–1.20)
GFR: 79.71 mL/min (ref >60.00)
Glucose, Bld: 102 mg/dL — ABNORMAL HIGH (ref 70–99)
Potassium: 4 mEq/L (ref 3.5–5.1)
Sodium: 137 mEq/L (ref 135–145)
TOTAL PROTEIN: 7.4 g/dL (ref 6.0–8.3)

## 2014-06-02 LAB — URINE CULTURE
COLONY COUNT: NO GROWTH
Organism ID, Bacteria: NO GROWTH

## 2014-06-22 ENCOUNTER — Other Ambulatory Visit: Payer: Self-pay | Admitting: Internal Medicine

## 2014-06-23 ENCOUNTER — Ambulatory Visit (HOSPITAL_BASED_OUTPATIENT_CLINIC_OR_DEPARTMENT_OTHER)
Admission: RE | Admit: 2014-06-23 | Discharge: 2014-06-23 | Disposition: A | Discharge status: Home / Self Care | Payer: 59 | Source: Ambulatory Visit | Attending: Physician Assistant | Admitting: Physician Assistant

## 2014-06-23 ENCOUNTER — Ambulatory Visit (INDEPENDENT_AMBULATORY_CARE_PROVIDER_SITE_OTHER): Payer: 59 | Admitting: Physician Assistant

## 2014-06-23 ENCOUNTER — Encounter: Payer: Self-pay | Admitting: Physician Assistant

## 2014-06-23 VITALS — BP 155/74 | HR 89 | Temp 98.1°F | Ht 67.0 in | Wt 217.8 lb

## 2014-06-23 DIAGNOSIS — W19XXXA Unspecified fall, initial encounter: Secondary | ICD-10-CM | POA: Diagnosis not present

## 2014-06-23 DIAGNOSIS — M25461 Effusion, right knee: Secondary | ICD-10-CM | POA: Insufficient documentation

## 2014-06-23 DIAGNOSIS — M1711 Unilateral primary osteoarthritis, right knee: Secondary | ICD-10-CM | POA: Insufficient documentation

## 2014-06-23 DIAGNOSIS — S8001XA Contusion of right knee, initial encounter: Secondary | ICD-10-CM | POA: Diagnosis present

## 2014-06-23 DIAGNOSIS — S8991XA Unspecified injury of right lower leg, initial encounter: Secondary | ICD-10-CM | POA: Diagnosis not present

## 2014-06-23 DIAGNOSIS — M25761 Osteophyte, right knee: Secondary | ICD-10-CM | POA: Diagnosis not present

## 2014-06-23 MED ORDER — MELOXICAM 15 MG PO TABS: 15.0000 mg | ORAL_TABLET | Freq: Every day | ORAL | Status: DC

## 2014-06-23 NOTE — Patient Instructions (Signed)
Please go downstairs to the first floor for x-ray.  We will call you with your results.  Take Mobic daily with food.  Use Extra Strength Tylenol for breakthrough pain.  Continue rest, ice and elevation of leg.  Get a knee sleeve from the pharmacy to wear as this will add stability.  We will know more once x-ray has resulted.

## 2014-06-23 NOTE — Progress Notes (Signed)
Pre visit review using our clinic review tool, if applicable. No additional management support is needed unless otherwise documented below in the visit note. 

## 2014-06-24 ENCOUNTER — Telehealth: Payer: Self-pay | Admitting: Internal Medicine

## 2014-06-24 NOTE — Telephone Encounter (Signed)
Notified of results, please see lab note.

## 2014-06-24 NOTE — Telephone Encounter (Signed)
Reason for call: Pt returning call. She states ok to leave detailed msg. She's had some trouble with her phone.

## 2014-06-25 DIAGNOSIS — S8991XA Unspecified injury of right lower leg, initial encounter: Secondary | ICD-10-CM | POA: Insufficient documentation

## 2014-06-25 NOTE — Assessment & Plan Note (Signed)
Will obtain x-ray to further assess. Bracing discussed.  RICE therapy.  Rx Mobic.  Follow-up in 1 week.

## 2014-06-25 NOTE — Progress Notes (Signed)
Patient presents to clinic today c/o pain in R knee for 1.5 weeks after sustaining a fall directly onto the R knee.  Endorses swelling, bruising and pain with palpation of knee.  Denies pain with ambulation or weight bearing. Denies numbness, tingling or weakness of extremity.  Past Medical History  Diagnosis Date  . MS (multiple sclerosis)   . Gestational diabetes   . HTN (hypertension)   . Essential tremor   . Contraception     husband vasectomy  . Proteinuria     Reports mild, chronic proteinuria    Current Outpatient Prescriptions on File Prior to Visit  Medication Sig Dispense Refill  . lisinopril (PRINIVIL,ZESTRIL) 10 MG tablet Take 1 tablet (10 mg total) by mouth daily. 30 tablet 11  . Loratadine (CLARITIN PO) Take 1 tablet by mouth daily as needed. [facility] Brand OTC    . Probiotic Product (PROBIOTIC DAILY PO) Take 1 tablet by mouth daily.     No current facility-administered medications on file prior to visit.    Allergies  Allergen Reactions  . Sulfa Antibiotics Hives    Bactrim caused Hives  . Tetanus Toxoids     Family History  Problem Relation Age of Onset  . Arthritis Mother   . Alcohol abuse Mother   . Hypertension Brother   . Heart disease Paternal Grandfather   . Stroke Paternal Grandmother   . Diabetes Father     Borderline  . Diabetes Maternal Grandfather   . Sudden death Other     uncle, GF  . Heart attack Paternal Grandfather   . Clotting disorder Father   . Colon cancer Neg Hx   . Breast cancer Neg Hx     History   Social History  . Marital Status: Married    Spouse Name: N/A  . Number of Children: 2  . Years of Education: N/A   Occupational History  . 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 Smoking status: Former Research scientist (life sciences)  . Smokeless tobacco: Never Used     Comment: 1 ppd x 20 year, quit 2010  . Alcohol Use: Yes     Comment: Socially  . Drug Use: No  . Sexual Activity: Not on file   Other  Topics Concern  . None   Social History Narrative   Moved from [place] July 2014   G3P2   Review of Systems - See HPI.  All other ROS are negative.  BP 155/74 mmHg  Pulse 89  Temp(Src) 98.1 F (36.7 C) (Oral)  Ht 5' 7" (1.702 m)  Wt 217 lb 12.8 oz (98.793 kg)  BMI 34.10 kg/m2  SpO2 100%  LMP 06/16/2014  Physical Exam  Constitutional: She is oriented to person, place, and time and well-developed, well-nourished, and in no distress.  HENT:  Head: Normocephalic and atraumatic.  Cardiovascular: Normal rate, regular rhythm, normal heart sounds and intact distal pulses.   Pulmonary/Chest: Effort normal and breath sounds normal.  Musculoskeletal:       Right knee: She exhibits ecchymosis. She exhibits no swelling, normal alignment, no LCL laxity, normal patellar mobility, normal meniscus and no MCL laxity. Tenderness found. Medial joint line and patellar tendon tenderness noted.  Neurological: She is alert and oriented to person, place, and time.  Skin: Skin is warm and dry. No rash noted.  Psychiatric: Affect normal.  Vitals reviewed.   Recent Results (from the past 2160 hour(s))  Comp Met (CMET)  Status: Abnormal   Collection Time: 05/31/14  8:24 AM  Result Value Ref Range   Sodium 137 135 - 145 mEq/L   Potassium 4.0 3.5 - 5.1 mEq/L   Chloride 101 96 - 112 mEq/L   CO2 27 19 - 32 mEq/L   Glucose, Bld 102 (H) 70 - 99 mg/dL   BUN 13 6 - 23 mg/dL   Creatinine, Ser 0.82 0.40 - 1.20 mg/dL   Total Bilirubin 0.5 0.2 - 1.2 mg/dL   Alkaline Phosphatase 55 39 - 117 U/L   AST 13 0 - 37 U/L   ALT 23 0 - 35 U/L   Total Protein 7.4 6.0 - 8.3 g/dL   Albumin 4.1 3.5 - 5.2 g/dL   Calcium 9.4 8.4 - 10.5 mg/dL   GFR 79.71 >60.00 mL/min  Lipid panel     Status: Abnormal   Collection Time: 05/31/14  8:24 AM  Result Value Ref Range   Cholesterol 189 0 - 200 mg/dL    Comment: ATP III Classification       Desirable:  < 200 mg/dL               Borderline High:  200 - 239 mg/dL          High:   > = 240 mg/dL   Triglycerides 151.0 (H) 0.0 - 149.0 mg/dL    Comment: Normal:  <150 mg/dLBorderline High:  150 - 199 mg/dL   HDL 47.20 >39.00 mg/dL   VLDL 30.2 0.0 - 40.0 mg/dL   LDL Cholesterol 112 (H) 0 - 99 mg/dL   Total CHOL/HDL Ratio 4     Comment:                Men          Women1/2 Average Risk     3.4          3.3Average Risk          5.0          4.42X Average Risk          9.6          7.13X Average Risk          15.0          11.0                       NonHDL 141.80     Comment: NOTE:  Non-HDL goal should be 30 mg/dL higher than patient's LDL goal (i.e. LDL goal of < 70 mg/dL, would have non-HDL goal of < 100 mg/dL)  CBC with Differential/Platelet     Status: Abnormal   Collection Time: 05/31/14  8:24 AM  Result Value Ref Range   WBC 8.7 4.0 - 10.5 K/uL   RBC 5.20 (H) 3.87 - 5.11 Mil/uL   Hemoglobin 14.3 12.0 - 15.0 g/dL   HCT 42.9 36.0 - 46.0 %   MCV 82.6 78.0 - 100.0 fl   MCHC 33.3 30.0 - 36.0 g/dL   RDW 13.7 11.5 - 15.5 %   Platelets 279.0 150.0 - 400.0 K/uL   Neutrophils Relative % 58.7 43.0 - 77.0 %   Lymphocytes Relative 30.9 12.0 - 46.0 %   Monocytes Relative 7.0 3.0 - 12.0 %   Eosinophils Relative 2.7 0.0 - 5.0 %   Basophils Relative 0.7 0.0 - 3.0 %   Neutro Abs 5.1 1.4 - 7.7 K/uL   Lymphs Abs 2.7 0.7 - 4.0 K/uL  Monocytes Absolute 0.6 0.1 - 1.0 K/uL   Eosinophils Absolute 0.2 0.0 - 0.7 K/uL   Basophils Absolute 0.1 0.0 - 0.1 K/uL  TSH     Status: None   Collection Time: 05/31/14  8:24 AM  Result Value Ref Range   TSH 1.20 0.35 - 4.50 uIU/mL  Urine Culture     Status: None   Collection Time: 05/31/14  8:24 AM  Result Value Ref Range   Colony Count NO GROWTH    Organism ID, Bacteria NO GROWTH   Urinalysis, Routine w reflex microscopic     Status: None   Collection Time: 05/31/14  8:24 AM  Result Value Ref Range   Color, Urine YELLOW Yellow;Lt. Yellow   APPearance CLEAR Clear   Specific Gravity, Urine 1.015 1.000-1.030   pH 7.5 5.0 - 8.0   Total  Protein, Urine NEGATIVE Negative   Urine Glucose NEGATIVE Negative   Ketones, ur NEGATIVE Negative   Bilirubin Urine NEGATIVE Negative   Hgb urine dipstick NEGATIVE Negative   Urobilinogen, UA 0.2 0.0 - 1.0   Leukocytes, UA NEGATIVE Negative   Nitrite NEGATIVE Negative   WBC, UA none seen 0-2/hpf   RBC / HPF none seen 0-2/hpf   Squamous Epithelial / LPF Rare(0-4/hpf) Rare(0-4/hpf)    Assessment/Plan: Right knee injury Will obtain x-ray to further assess. Bracing discussed.  RICE therapy.  Rx Mobic.  Follow-up in 1 week.       

## 2014-11-16 ENCOUNTER — Ambulatory Visit (INDEPENDENT_AMBULATORY_CARE_PROVIDER_SITE_OTHER): Payer: 59 | Admitting: Internal Medicine

## 2014-11-16 ENCOUNTER — Encounter (INDEPENDENT_AMBULATORY_CARE_PROVIDER_SITE_OTHER): Payer: Self-pay

## 2014-11-16 ENCOUNTER — Encounter: Payer: Self-pay | Admitting: Internal Medicine

## 2014-11-16 VITALS — BP 128/74 | HR 100 | Temp 97.7°F | Ht 67.0 in | Wt 220.5 lb

## 2014-11-16 DIAGNOSIS — J019 Acute sinusitis, unspecified: Secondary | ICD-10-CM | POA: Diagnosis not present

## 2014-11-16 DIAGNOSIS — I10 Essential (primary) hypertension: Secondary | ICD-10-CM

## 2014-11-16 DIAGNOSIS — Z09 Encounter for follow-up examination after completed treatment for conditions other than malignant neoplasm: Secondary | ICD-10-CM | POA: Insufficient documentation

## 2014-11-16 MED ORDER — LISINOPRIL 20 MG PO TABS: 20.0000 mg | ORAL_TABLET | Freq: Every day | ORAL | Status: DC

## 2014-11-16 MED ORDER — MUPIROCIN 2 % EX OINT: 1.0000 "application " | TOPICAL_OINTMENT | Freq: Two times a day (BID) | CUTANEOUS | Status: DC

## 2014-11-16 MED ORDER — AMOXICILLIN 500 MG PO CAPS: 1000.0000 mg | ORAL_CAPSULE | Freq: Two times a day (BID) | ORAL | Status: DC

## 2014-11-16 NOTE — Assessment & Plan Note (Signed)
Mild sinusitis: We'll recommend conservative treatment, see AVS, if not better will take amoxicillin. Prescribed mupirocin, suspect follicle infection at the nose. HTN: On lisinopril 10 mg, BP at home has increased a little lately, BPs in the clinic usually okay except one time was in the 150s. Will increase lisinopril to 20 mg, check a BMP in one month

## 2014-11-16 NOTE — Patient Instructions (Signed)
Rest, fluids , tylenol  For cough: Take Mucinex DM twice a day as needed until better  For nasal congestion Use OTC Nasocort or Flonase : 2 nasal sprays on each side of the nose daily until you feel better  Use  mupirocin to the right nose daily for one week  Take the antibiotic as prescribed  (Amoxicillin) if you are not improving in the next 4 or 5 days  Call if not gradually better over the next  10 days  Call anytime if the symptoms are severe   ------------------ Increase lisinopril to 20 mg daily  Check the  blood pressure weekly  Be sure your blood pressure is between 110/65 and  145/85.  if it is consistently higher or lower, let me know  Please come back in one month for a blood check (BMP)

## 2014-11-16 NOTE — Progress Notes (Signed)
Subjective:    Patient ID: Maria Graves, female    DOB: [birth year], 44 y.o.   MRN: [id number]  DOS:  11/16/2014 Type of visit - description : Acute visit Interval history: Symptoms started 3 weeks ago with "a sinus infection". Had low-grade temperature initially, she is using Mucinex, she continue with sinus congestion and clear nasal discharge. She self medicated with amoxicillin the last 2 days. Few days ago developed pain at the right side of the nose and yesterday noted a bump in that area. HTN-- She is taking lisinopril 10 mg, ambulatory BPs have increased to 130s and even 147 this morning. She is concerned. She has an arm cuff and does not seem to be small.   Review of Systems See history of present illness  Past Medical History  Diagnosis Date  . MS (multiple sclerosis) (HCC)   . Gestational diabetes   . HTN (hypertension)   . Essential tremor   . Contraception     husband vasectomy  . Proteinuria     Reports mild, chronic proteinuria    Past Surgical History  Procedure Laterality Date  . Tonsillectomy  1991    Social History   Social History  . Marital Status: Married    Spouse Name: N/A  . Number of Children: 2  . Years of Education: N/A   Occupational History  . 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 Smoking status: Former Games developer  . Smokeless tobacco: Never Used     Comment: 1 ppd x 20 year, quit 2010  . Alcohol Use: Yes     Comment: Socially  . Drug Use: No  . Sexual Activity: Not on file   Other Topics Concern  . Not on file   Social History Narrative   Moved from [place] July 2014   G3P2        Medication List       This list is accurate as of: 11/16/14  5:42 PM.  Always use your most recent med list.               amoxicillin 500 MG capsule  Commonly known as:  AMOXIL  Take 2 capsules (1,000 mg total) by mouth 2 (two) times daily.     CLARITIN PO  Take 1 tablet by mouth daily as needed.  [facility] Brand OTC     lisinopril 20 MG tablet  Commonly known as:  PRINIVIL,ZESTRIL  Take 1 tablet (20 mg total) by mouth daily.     meloxicam 15 MG tablet  Commonly known as:  MOBIC  Take 1 tablet (15 mg total) by mouth daily.     mupirocin ointment 2 %  Commonly known as:  BACTROBAN  Place 1 application into the nose 2 (two) times daily.     PROBIOTIC DAILY PO  Take 1 tablet by mouth daily.           Objective:   Physical Exam BP 128/74 mmHg  Pulse 100  Temp(Src) 97.7 F (36.5 C) (Oral)  Ht  (1.702 m)  Wt 220 lb 8 oz (100.018 kg)  BMI 34.53 kg/m2  SpO2 99%  LMP 10/25/2014 (Approximate) General:   Well developed, well nourished . NAD.  HEENT:  Normocephalic . Face symmetric, atraumatic. TMs no red, the left side is bulge. No discharge. Nose symmetric, slightly TTP on the right side. Throat symmetric and not redness. Sinuses not TTP Lungs:  CTA  B Normal respiratory effort, no intercostal retractions, no accessory muscle use. Heart: RRR,  no murmur.  No pretibial edema bilaterally  Skin: Not pale. Not jaundice Neurologic:  alert & oriented X3.  Speech normal, gait appropriate for age and unassisted Psych--  Cognition and judgment appear intact.  Cooperative with normal attention span and concentration.  Behavior appropriate. No anxious or depressed appearing.      Assessment & Plan:   Assessment> Multiple sclerosis Essential tremor HTN Pulmonary nodule, last CT 04-2014, next 1 year Gestational diabetes Proteinuria: Mild, chronic Contraception: Husband vasectomy  Plan Mild sinusitis: We'll recommend conservative treatment, see AVS, if not better will take amoxicillin. Prescribed mupirocin, suspect follicle infection at the nose. HTN: On lisinopril 10 mg, BP at home has increased a little lately, BPs in the clinic usually okay except one time was in the 150s. Will increase lisinopril to 20 mg, check a BMP in one month Primary care: Declined a flu  shot

## 2014-11-16 NOTE — Progress Notes (Signed)
Pre visit review using our clinic review tool, if applicable. No additional management support is needed unless otherwise documented below in the visit note. 

## 2014-11-30 LAB — HM PAP SMEAR: HM PAP: NORMAL

## 2014-11-30 LAB — HM MAMMOGRAPHY: HM Mammogram: ABNORMAL — AB (ref 0–4)

## 2014-12-09 ENCOUNTER — Other Ambulatory Visit: Payer: Self-pay | Admitting: Obstetrics & Gynecology

## 2014-12-09 DIAGNOSIS — R928 Other abnormal and inconclusive findings on diagnostic imaging of breast: Secondary | ICD-10-CM

## 2014-12-13 ENCOUNTER — Other Ambulatory Visit (INDEPENDENT_AMBULATORY_CARE_PROVIDER_SITE_OTHER): Payer: 59

## 2014-12-13 DIAGNOSIS — I10 Essential (primary) hypertension: Secondary | ICD-10-CM

## 2014-12-13 LAB — BASIC METABOLIC PANEL
BUN: 14 mg/dL (ref 6–23)
CO2: 27 mEq/L (ref 19–32)
Calcium: 9.2 mg/dL (ref 8.4–10.5)
Chloride: 104 mEq/L (ref 96–112)
Creatinine, Ser: 0.76 mg/dL (ref 0.40–1.20)
GFR: 86.81 mL/min (ref >60.00)
GLUCOSE: 114 mg/dL — AB (ref 70–99)
POTASSIUM: 4.3 meq/L (ref 3.5–5.1)
Sodium: 139 mEq/L (ref 135–145)

## 2014-12-15 ENCOUNTER — Other Ambulatory Visit: Payer: 59

## 2014-12-20 ENCOUNTER — Ambulatory Visit
Admission: RE | Admit: 2014-12-20 | Discharge: 2014-12-20 | Disposition: A | Discharge status: Home / Self Care | Payer: 59 | Source: Ambulatory Visit | Attending: Obstetrics & Gynecology | Admitting: Obstetrics & Gynecology

## 2014-12-20 DIAGNOSIS — R928 Other abnormal and inconclusive findings on diagnostic imaging of breast: Secondary | ICD-10-CM

## 2015-03-15 ENCOUNTER — Other Ambulatory Visit: Payer: Self-pay | Admitting: Internal Medicine

## 2015-03-18 ENCOUNTER — Other Ambulatory Visit: Payer: Self-pay | Admitting: Internal Medicine

## 2015-03-18 ENCOUNTER — Telehealth: Payer: Self-pay | Admitting: Internal Medicine

## 2015-03-18 NOTE — Telephone Encounter (Signed)
Caller name: Self  Can be reached: [phone] Pharmacy:  Reason for call:

## 2015-03-22 NOTE — Telephone Encounter (Signed)
error:[id number] ° °

## 2015-04-15 ENCOUNTER — Encounter: Payer: Self-pay | Admitting: Family Medicine

## 2015-04-15 ENCOUNTER — Ambulatory Visit (INDEPENDENT_AMBULATORY_CARE_PROVIDER_SITE_OTHER): Payer: 59 | Admitting: Family Medicine

## 2015-04-15 VITALS — BP 150/99 | HR 93 | Temp 98.1°F | Resp 20 | Wt 204.5 lb

## 2015-04-15 DIAGNOSIS — J0100 Acute maxillary sinusitis, unspecified: Secondary | ICD-10-CM | POA: Diagnosis not present

## 2015-04-15 MED ORDER — PREDNISONE 20 MG PO TABS: 40.0000 mg | ORAL_TABLET | Freq: Every day | ORAL | Status: DC

## 2015-04-15 MED ORDER — DOXYCYCLINE HYCLATE 100 MG PO TABS: 100.0000 mg | ORAL_TABLET | Freq: Two times a day (BID) | ORAL | Status: DC

## 2015-04-15 NOTE — Patient Instructions (Addendum)
Flonase,mucinex Nasal saline  Doxycycline, prednisone

## 2015-04-15 NOTE — Progress Notes (Signed)
Patient ID: Maria Graves, female   DOB: [birth year], 45 y.o.   MRN: [id number]    Maria Graves , 1970/10/06, 45 y.o., female MRN: [id number]  CC: flu-like symptoms  Subjective: Pt presents for an acute OV with complaints of body aches, fever, fatigue, headache of 6 days duration. Patient endorses cough and wheezing. Pt has tried ibuprofen to ease their symptoms, last dose 3 hours ago. She states her son has been sick as well, but he has been able to get over his illness much quicker. She reports her symptoms seem to be worsening.  Patient had declined her flu shot, she is allergic to tetanus toxoid therefore not up-to-date on tetanus  Allergies  Allergen Reactions  . Erythromycin Base Nausea Only  . Sulfa Antibiotics Hives    Bactrim caused Hives  . Tetanus Toxoids    Social History  Substance Use Topics  . Smoking status: Former Games developer  . Smokeless tobacco: Never Used     Comment: 1 ppd x 20 year, quit 2010  . Alcohol Use: Yes     Comment: Socially   Past Medical History  Diagnosis Date  . MS (multiple sclerosis) (HCC)   . Gestational diabetes   . HTN (hypertension)   . Essential tremor   . Contraception     husband vasectomy  . Proteinuria     Reports mild, chronic proteinuria   Past Surgical History  Procedure Laterality Date  . Tonsillectomy  1991   Family History  Problem Relation Age of Onset  . Arthritis Mother   . Alcohol abuse Mother   . Hypertension Brother   . Heart disease Paternal Grandfather   . Stroke Paternal Grandmother   . Diabetes Father     Borderline  . Diabetes Maternal Grandfather   . Sudden death Other     uncle, GF  . Heart attack Paternal Grandfather   . Clotting disorder Father   . Colon cancer Neg Hx   . Breast cancer Neg Hx      Medication List       This list is accurate as of: 04/15/15  4:29 PM.  Always use your most recent med list.               baclofen 20 MG tablet  Commonly known as:  LIORESAL  Take 20 mg by  mouth 3 (three) times daily.     CLARITIN PO  Take 1 tablet by mouth daily as needed. [facility] Brand OTC     lisinopril 20 MG tablet  Commonly known as:  PRINIVIL,ZESTRIL  Take 1 tablet (20 mg total) by mouth daily.     meloxicam 15 MG tablet  Commonly known as:  MOBIC  Take 1 tablet (15 mg total) by mouth daily.     mupirocin ointment 2 %  Commonly known as:  BACTROBAN  Place 1 application into the nose 2 (two) times daily.     PROBIOTIC DAILY PO  Take 1 tablet by mouth daily.        ROS: Negative, with the exception of above mentioned in HPI Objective:  BP 150/99 mmHg  Pulse 93  Temp(Src) 98.1 F (36.7 C)  Resp 20  Wt 204 lb 8 oz (92.761 kg)  SpO2 100% Body mass index is 32.02 kg/(m^2). Gen: Afebrile. No acute distress. Nontoxic in appearance, well-developed, well-nourished, Caucasian female. Appears fatigued. A pleasant. HENT: AT. [place]. Bilateral TM, air-fluid levels present bilaterally, no erythema or bulging. MMM, no oral lesions. Bilateral nares  with severe erythema, mild bogginess. Throat without erythema or exudates. Cough present on exam, hoarseness present on exam. Tender to palpation left greater than right maxillary sinus. Eyes:Pupils Equal Round Reactive to light, Extraocular movements intact,  Conjunctiva without redness, discharge or icterus. Neck/lymp/endocrine: Supple, anterior cervical lymphadenopathy present CV: RRR  Chest: Diffuse inspiratory and expiratory wheezing present. Good air movement, normal resp effort.  Abd: Soft. . NTND. BS present.  Skin: No rashes, purpura or petechiae.  Neuro: Normal gait. PERLA. EOMi. Alert. Oriented x3  Assessment/Plan: Maria Graves is a 45 y.o. female present for acute OV for  Acute maxillary sinusitis, recurrence not specified/Cough/wheeze - Discussed treatment plan with patient today, encouraged her to have a chest x-ray completed of her weakened to ensure she doesn't have pneumonia like symptoms. Will treat with  doxycycline cover both upper and lower respiratory pathogens. Patient was prescribed prednisone, she's not certain if she would like to take it she states she gains weight with the use. She was provided with a prescription in the event she would decide to use it. - Encourage her to use Flonase, plain Mucinex or Robitussin-DM, nasal saline washes. Rest and hydrate. - DG Chest 2 View; Future - doxycycline (VIBRA-TABS) 100 MG tablet; Take 1 tablet (100 mg total) by mouth 2 (two) times daily.  Dispense: 20 tablet; Refill: 0 - predniSONE (DELTASONE) 20 MG tablet; Take 2 tablets (40 mg total) by mouth daily with breakfast.  Dispense: 10 tablet; Refill: 0 - Follow-up when necessary, if pneumonia on x-ray would want to follow-up in a week to ensure improvement.  electronically signed by:  Felix Pacini, DO  [facility] [place]

## 2015-04-16 ENCOUNTER — Ambulatory Visit (HOSPITAL_BASED_OUTPATIENT_CLINIC_OR_DEPARTMENT_OTHER)
Admission: RE | Admit: 2015-04-16 | Discharge: 2015-04-16 | Disposition: A | Discharge status: Home / Self Care | Payer: 59 | Source: Ambulatory Visit | Attending: Family Medicine | Admitting: Family Medicine

## 2015-04-16 DIAGNOSIS — R509 Fever, unspecified: Secondary | ICD-10-CM | POA: Insufficient documentation

## 2015-04-16 DIAGNOSIS — R05 Cough: Secondary | ICD-10-CM | POA: Diagnosis present

## 2015-04-16 DIAGNOSIS — J0100 Acute maxillary sinusitis, unspecified: Secondary | ICD-10-CM

## 2015-04-16 DIAGNOSIS — R5383 Other fatigue: Secondary | ICD-10-CM | POA: Insufficient documentation

## 2015-04-18 ENCOUNTER — Telehealth: Payer: Self-pay | Admitting: Family Medicine

## 2015-04-18 ENCOUNTER — Telehealth: Payer: Self-pay | Admitting: *Deleted

## 2015-04-18 MED ORDER — FLUCONAZOLE 150 MG PO TABS: 150.0000 mg | ORAL_TABLET | Freq: Once | ORAL | Status: DC

## 2015-04-18 NOTE — Telephone Encounter (Signed)
Spoke with patient advised to complete antibiotic as ordered.

## 2015-04-18 NOTE — Telephone Encounter (Signed)
Please call pt: - xray did not show any signs of pneumonia.

## 2015-04-18 NOTE — Telephone Encounter (Signed)
Rx sent per Dr Claiborne Billings

## 2015-04-18 NOTE — Telephone Encounter (Signed)
Patient is requesting diflucan for yeast infection from antibiotic. She goes to [facility].

## 2015-04-18 NOTE — Telephone Encounter (Signed)
Pt received message about Xray and asking if she still needs to take doxycycline?

## 2015-04-18 NOTE — Telephone Encounter (Signed)
Left message on patient voice mail with xray results.

## 2015-05-30 ENCOUNTER — Encounter: Payer: Self-pay | Admitting: Internal Medicine

## 2015-05-30 ENCOUNTER — Ambulatory Visit (INDEPENDENT_AMBULATORY_CARE_PROVIDER_SITE_OTHER): Payer: 59 | Admitting: Internal Medicine

## 2015-05-30 VITALS — BP 132/78 | HR 86 | Temp 97.9°F | Resp 16 | Ht 67.25 in | Wt 200.2 lb

## 2015-05-30 DIAGNOSIS — Z09 Encounter for follow-up examination after completed treatment for conditions other than malignant neoplasm: Secondary | ICD-10-CM

## 2015-05-30 DIAGNOSIS — Z87898 Personal history of other specified conditions: Secondary | ICD-10-CM

## 2015-05-30 DIAGNOSIS — Z0000 Encounter for general adult medical examination without abnormal findings: Secondary | ICD-10-CM | POA: Diagnosis not present

## 2015-05-30 DIAGNOSIS — G35 Multiple sclerosis: Secondary | ICD-10-CM

## 2015-05-30 DIAGNOSIS — G35D Multiple sclerosis, unspecified: Secondary | ICD-10-CM

## 2015-05-30 LAB — BASIC METABOLIC PANEL
BUN: 12 mg/dL (ref 6–23)
CHLORIDE: 104 meq/L (ref 96–112)
CO2: 27 mEq/L (ref 19–32)
Calcium: 9.2 mg/dL (ref 8.4–10.5)
Creatinine, Ser: 0.73 mg/dL (ref 0.40–1.20)
GFR: 90.76 mL/min (ref >60.00)
Glucose, Bld: 107 mg/dL — ABNORMAL HIGH (ref 70–99)
POTASSIUM: 3.8 meq/L (ref 3.5–5.1)
SODIUM: 139 meq/L (ref 135–145)

## 2015-05-30 LAB — LIPID PANEL
CHOL/HDL RATIO: 4
Cholesterol: 153 mg/dL (ref 0–200)
HDL: 38.6 mg/dL — AB (ref >39.00)
LDL CALC: 98 mg/dL (ref 0–99)
NonHDL: 114.8
TRIGLYCERIDES: 84 mg/dL (ref 0.0–149.0)
VLDL: 16.8 mg/dL (ref 0.0–40.0)

## 2015-05-30 LAB — HEMOGLOBIN A1C: Hgb A1c MFr Bld: 5.8 % (ref 4.6–6.5)

## 2015-05-30 LAB — VITAMIN D 25 HYDROXY (VIT D DEFICIENCY, FRACTURES): VITD: 17.43 ng/mL — ABNORMAL LOW (ref 30.00–100.00)

## 2015-05-30 NOTE — Assessment & Plan Note (Signed)
MS: Patient with MS, having episodes of dizziness. We will get a MRI of the brain (w, w/o dw rads), she will call neurology as she has not been seen in a while. If sx resurface let me know. Dizziness: As above, could be related to MS versus peripheral etiology vs others HTN: Well-controlled Pulmonary nodule: CT chest h/o gestational diabetes: Check A1c RTC 6 months

## 2015-05-30 NOTE — Progress Notes (Signed)
Pre visit review using our clinic review tool, if applicable. No additional management support is needed unless otherwise documented below in the visit note. 

## 2015-05-30 NOTE — Assessment & Plan Note (Addendum)
Td -- intolerant , ++ swelling    Female care per Gyn Dr Laurie Panda;  Updated on PAPs-MMGs  Never had a cscope  Diet discussed, calorie counting? Continued to remain active. Tobacco: Counselor. To call when/if ready for medications.

## 2015-05-30 NOTE — Patient Instructions (Signed)
GO TO THE LAB :      Get the blood work     GO TO THE FRONT DESK Schedule your next appointment for a  checkup in 6 months, nonfasting.   Please schedule a visit with your neurologist  If you have severe or recurrent dizziness please call us.  Will schedule a CT chest and brain MRI  Consider calorie counting, MYFITNESSPAL

## 2015-05-30 NOTE — Progress Notes (Signed)
Subjective:    Patient ID: Maria Graves, female    DOB: [birth year], 45 y.o.   MRN: [id number]  DOS:  05/30/2015 Type of visit - description : CPX Interval history: Remains active, trying to eat healthy, Was not able to lose any weight so restart smoking, has lost few pounds.  Wt Readings from Last 3 Encounters:  05/30/15 200 lb 3.2 oz (90.81 kg)  04/15/15 204 lb 8 oz (92.761 kg)  11/16/14 220 lb 8 oz (100.018 kg)       Review of Systems Constitutional: No fever. No chills. No unexplained wt changes. No unusual sweats  HEENT: No dental problems, no ear discharge, no facial swelling, no voice changes. No eye discharge, no eye  redness , no  intolerance to light   Respiratory: No wheezing , no  difficulty breathing. No cough , no mucus production  Cardiovascular: No CP, no leg swelling , no  Palpitations  GI: no nausea, no vomiting, no diarrhea , no  abdominal pain.  No blood in the stools. No dysphagia, no odynophagia    Endocrine: No polyphagia, no polyuria , no polydipsia  GU: No dysuria, gross hematuria, difficulty urinating. No urinary urgency, no frequency.  Musculoskeletal: No joint swellings or unusual aches or pains  Skin: No change in the color of the skin, palor , no  Rash  Allergic, immunologic: No environmental allergies , no  food allergies  Neurological: About 2 weeks ago she was traveling, during a period of 3 days have 5 episodes of dizziness: Spinning, lasted 5 minutes, no associated nausea, visual disturbances, slurred speech, headaches, chest pain, palpitations. Did not have any calf pain or swelling. The dizziness was associated with head motion x 1 or 2 times (For instance when she laid down in bed). No further episodes. No syncope. No headaches. No diplopia, no slurred, no slurred speech, no motor deficits, no facial  Numbness  Hematological: No enlarged lymph nodes, no easy bruising , no unusual bleedings  Psychiatry: No suicidal ideas, no  hallucinations, no beavior problems, no confusion.  No unusual/severe anxiety, no depression   Past Medical History  Diagnosis Date  . MS (multiple sclerosis) (HCC)   . Gestational diabetes   . HTN (hypertension)   . Essential tremor   . Contraception     husband vasectomy  . Proteinuria     Reports mild, chronic proteinuria    Past Surgical History  Procedure Laterality Date  . Tonsillectomy  1991    Social History   Social History  . Marital Status: Married    Spouse Name: N/A  . Number of Children: 2  . Years of Education: N/A   Occupational History  . 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 Smoking status: Current Every Day Smoker  . Smokeless tobacco: Never Used     Comment: 4-5 cigarettes daily  . Alcohol Use: 0.0 oz/week    0 Standard drinks or equivalent per week     Comment: Socially  . Drug Use: No  . Sexual Activity: Not on file   Other Topics Concern  . Not on file   Social History Narrative   Moved from [place] July 2014   G3P2        Medication List       This list is accurate as of: 05/30/15  7:40 PM.  Always use your most recent med list.  baclofen 20 MG tablet  Commonly known as:  LIORESAL  Take 20 mg by mouth 3 (three) times daily as needed.     CLARITIN PO  Take 1 tablet by mouth daily as needed. [facility] Brand OTC     lisinopril 20 MG tablet  Commonly known as:  PRINIVIL,ZESTRIL  Take 1 tablet (20 mg total) by mouth daily.     PROBIOTIC DAILY PO  Take 1 tablet by mouth daily.           Objective:   Physical Exam BP 132/78 mmHg  Pulse 86  Temp(Src) 97.9 F (36.6 C) (Oral)  Resp 16  Ht 5' 7.25" (1.708 m)  Wt 200 lb 3.2 oz (90.81 kg)  BMI 31.13 kg/m2  SpO2 99%  LMP 05/29/2015 (Exact Date)  General:   Well developed, well nourished . NAD.  Neck: No  thyromegaly , normal carotid pulse HEENT:  Normocephalic . Face symmetric, atraumatic Lungs:  CTA B Normal respiratory  effort, no intercostal retractions, no accessory muscle use. Heart: RRR,  no murmur.  No pretibial edema bilaterally  Abdomen:  Not distended, soft, non-tender. No rebound or rigidity.   Skin: Exposed areas without rash. Not pale. Not jaundice Neurologic:  alert & oriented X3.  Speech normal, gait appropriate for age and unassisted Strength symmetric and appropriate for age. DTRs symmetric stay set for a slightly decreased right ankle jerk. EOMI, pupils equal and reactive Psych: Cognition and judgment appear intact.  Cooperative with normal attention span and concentration.  Behavior appropriate. No anxious or depressed appearing.    Assessment & Plan:  Assessment> Multiple sclerosis: neuro @ [facility] Essential tremor HTN Pulmonary nodule, last CT 04-2014, next 1 year Gestational diabetes Proteinuria: Mild, chronic Contraception: Husband vasectomy  PLAN: MS: Patient with MS, having episodes of dizziness. We will get a MRI of the brain (w, w/o dw rads), she will call neurology as she has not been seen in a while. If sx resurface let me know. Dizziness: As above, could be related to MS versus peripheral etiology vs others HTN: Well-controlled Pulmonary nodule: CT chest h/o gestational diabetes: Check A1c RTC 6 months

## 2015-05-31 ENCOUNTER — Other Ambulatory Visit: Payer: Self-pay | Admitting: Internal Medicine

## 2015-05-31 MED ORDER — ERGOCALCIFEROL 1.25 MG (50000 UT) PO CAPS: 50000.0000 [IU] | ORAL_CAPSULE | ORAL | Status: DC

## 2015-05-31 NOTE — Addendum Note (Signed)
Addended by: Willow Ora E on: 05/31/2015 11:24 AM   Modules accepted: Orders, SmartSet

## 2015-06-06 ENCOUNTER — Other Ambulatory Visit: Payer: Self-pay | Admitting: Internal Medicine

## 2015-06-18 ENCOUNTER — Ambulatory Visit (HOSPITAL_BASED_OUTPATIENT_CLINIC_OR_DEPARTMENT_OTHER)
Admission: RE | Admit: 2015-06-18 | Discharge: 2015-06-18 | Disposition: A | Discharge status: Home / Self Care | Payer: 59 | Source: Ambulatory Visit | Attending: Internal Medicine | Admitting: Internal Medicine

## 2015-06-18 DIAGNOSIS — R918 Other nonspecific abnormal finding of lung field: Secondary | ICD-10-CM | POA: Diagnosis not present

## 2015-06-18 DIAGNOSIS — G35 Multiple sclerosis: Secondary | ICD-10-CM | POA: Diagnosis not present

## 2015-06-18 DIAGNOSIS — Z87898 Personal history of other specified conditions: Secondary | ICD-10-CM

## 2015-06-18 MED ORDER — GADOBENATE DIMEGLUMINE 529 MG/ML IV SOLN: 18.0000 mL | Freq: Once | INTRAVENOUS | Status: DC | PRN

## 2015-07-26 ENCOUNTER — Other Ambulatory Visit: Payer: Self-pay | Admitting: Internal Medicine

## 2015-08-19 ENCOUNTER — Other Ambulatory Visit: Payer: Self-pay | Admitting: Internal Medicine

## 2015-08-24 ENCOUNTER — Telehealth: Payer: Self-pay | Admitting: Internal Medicine

## 2015-08-24 NOTE — Telephone Encounter (Signed)
Relation to DH:[facility]BU Call back number:[phone]   Reason for call:  Patient requesting MRI  Results please fax to Dr. Gaynelle Adu neurologist fax # [phone]

## 2015-08-24 NOTE — Telephone Encounter (Signed)
MRI report from 06/18/2015 printed and faxed to number provided.

## 2015-09-06 ENCOUNTER — Telehealth: Payer: Self-pay

## 2015-09-06 NOTE — Telephone Encounter (Signed)
Biometric screening for completed w/ lab results from CPE on 05/30/2015. Original form given back to Pt, and copy sent for scanning.

## 2015-10-07 IMAGING — DX DG KNEE COMPLETE 4+V*R*
5 series · 5 of 5 positions shown · non-contrast
Comparison: None.

CLINICAL DATA: Fell onto right knee 1 week ago with swelling and
bruising

EXAM:
RIGHT KNEE - COMPLETE 4+ VIEW

[knee ap]
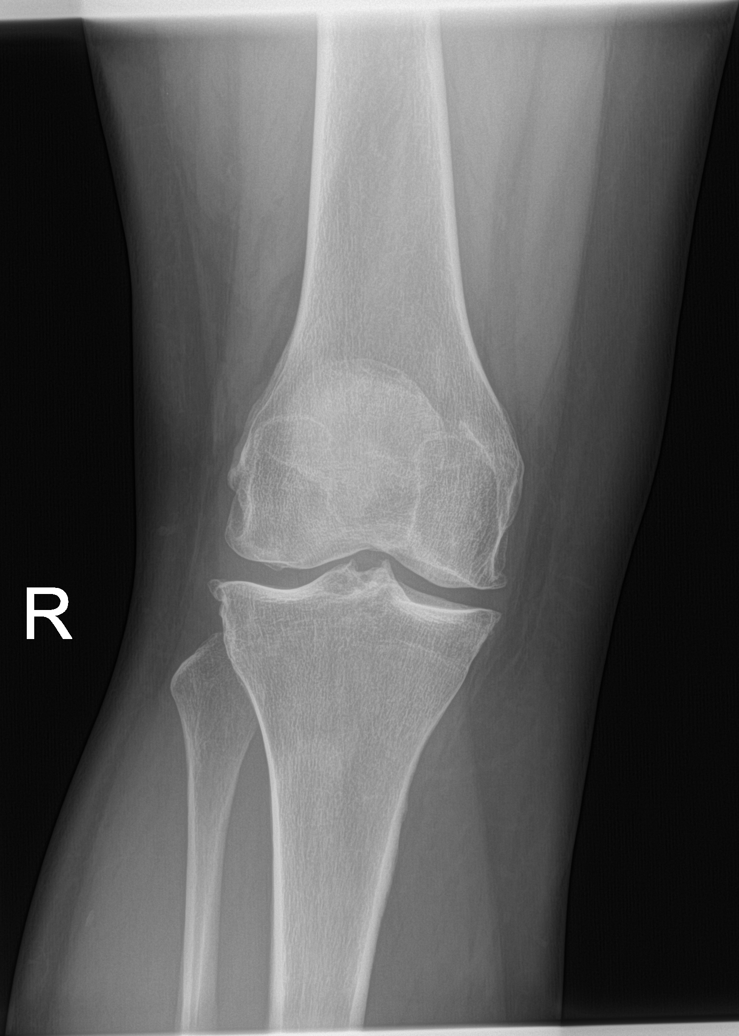

[knee lat]
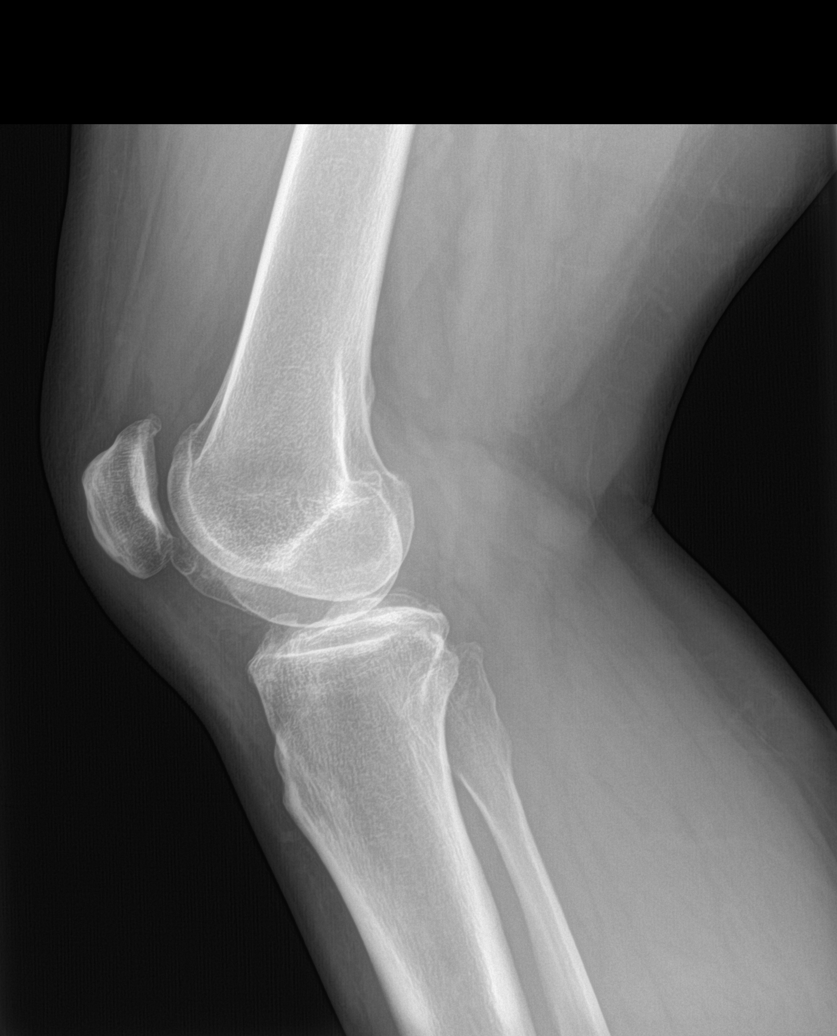

[knee sunrise]
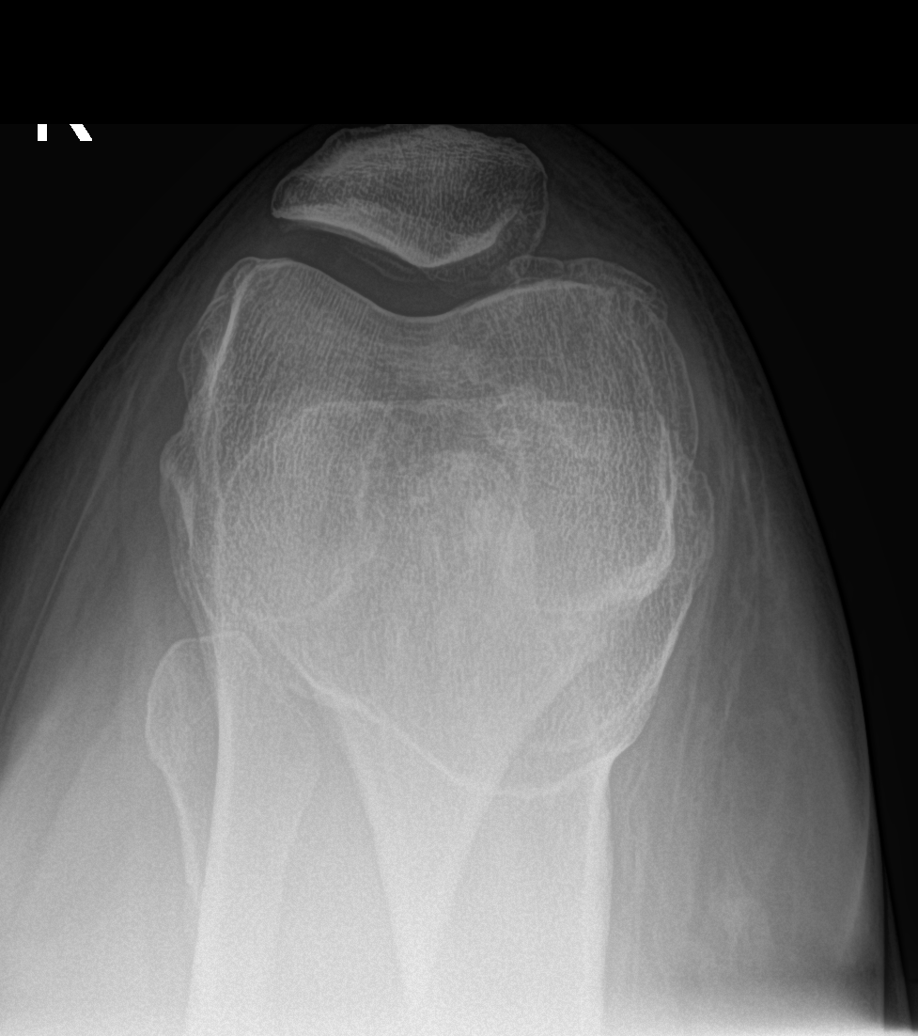

[knee obl (1 of 2)]
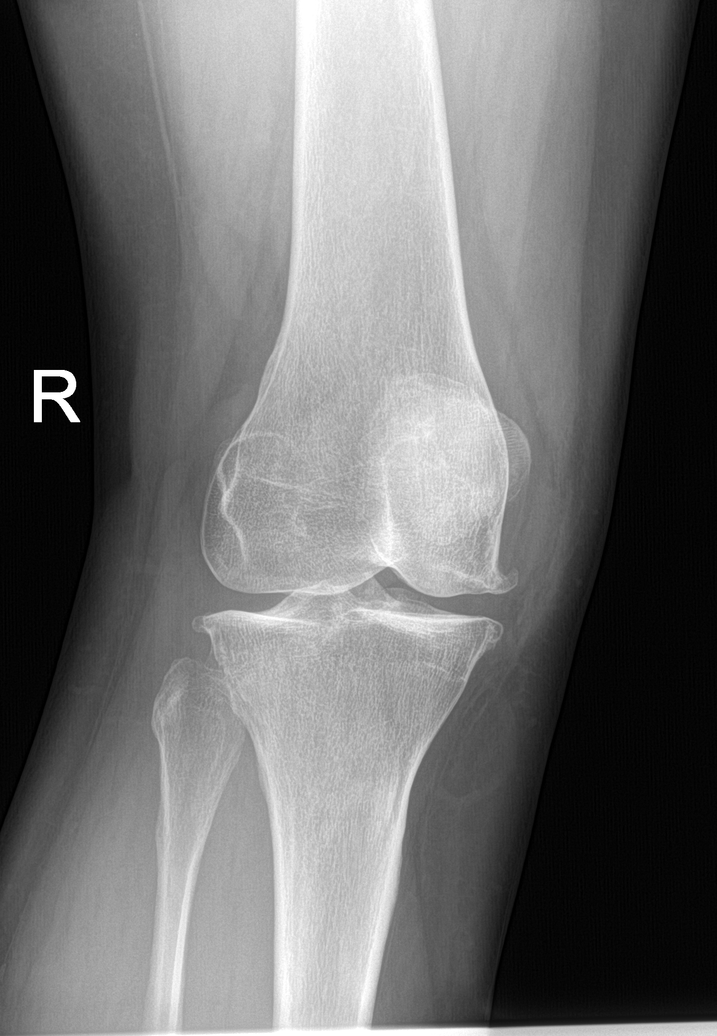

[knee obl (2 of 2)]
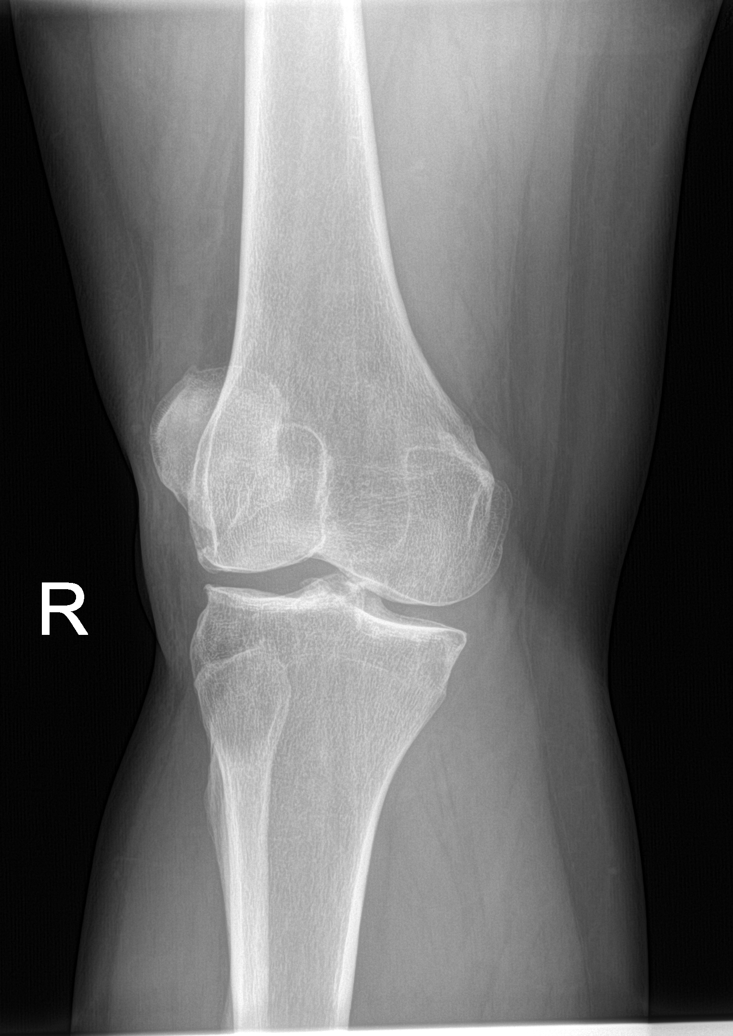

[5 of 5 positions shown; findings below may reference images not displayed]

FINDINGS: For age there is somewhat advanced degenerative joint disease of the
right knee with primarily bony osteophyte formation from all 3
compartments. No fracture is seen. A tiny amount of joint fluid
cannot be excluded. The patella is normally positioned.
IMPRESSION: Somewhat age advanced tricompartmental degenerative joint disease.
No fracture. Cannot exclude tiny amount of joint fluid.

## 2015-11-13 ENCOUNTER — Telehealth: Payer: 59 | Admitting: Family

## 2015-11-13 DIAGNOSIS — N390 Urinary tract infection, site not specified: Secondary | ICD-10-CM

## 2015-11-13 MED ORDER — NITROFURANTOIN MONOHYD MACRO 100 MG PO CAPS: 100.0000 mg | ORAL_CAPSULE | Freq: Two times a day (BID) | ORAL | 0 refills | Status: DC

## 2015-11-13 NOTE — Progress Notes (Signed)

## 2015-11-20 ENCOUNTER — Telehealth: Payer: 59 | Admitting: Physician Assistant

## 2015-11-20 DIAGNOSIS — B3731 Acute candidiasis of vulva and vagina: Secondary | ICD-10-CM

## 2015-11-20 DIAGNOSIS — B373 Candidiasis of vulva and vagina: Secondary | ICD-10-CM

## 2015-11-20 MED ORDER — FLUCONAZOLE 150 MG PO TABS: 150.0000 mg | ORAL_TABLET | Freq: Once | ORAL | 0 refills | Status: AC

## 2015-11-20 NOTE — Progress Notes (Signed)
We are sorry that you are not feeling well. Here is how we plan to help! Based on what you shared with me it looks like you: May have a yeast vaginosis  Vaginosis is an inflammation of the vagina that can result in discharge, itching and pain. The cause is usually a change in the normal balance of vaginal bacteria or an infection. Vaginosis can also result from reduced estrogen levels after menopause.  The most common causes of vaginosis are:   Bacterial vaginosis which results from an overgrowth of one on several organisms that are normally present in your vagina.   Yeast infections which are caused by a naturally occurring fungus called candida.   Vaginal atrophy (atrophic vaginosis) which results from the thinning of the vagina from reduced estrogen levels after menopause.   Trichomoniasis which is caused by a parasite and is commonly transmitted by sexual intercourse.  Factors that increase your risk of developing vaginosis include: Marland Kitchen Medications, such as antibiotics and steroids . Uncontrolled diabetes . Use of hygiene products such as bubble bath, vaginal spray or vaginal deodorant . Douching . Wearing damp or tight-fitting clothing . Using an intrauterine device (IUD) for birth control . Hormonal changes, such as those associated with pregnancy, birth control pills or menopause . Sexual activity . Having a sexually transmitted infection  Your treatment plan is A Diflucan (fluconazole - yes they are the same!) 150mg  tablet once. Repeat in 3 days if symptoms still linger.  I have electronically sent this prescription into the pharmacy that you have chosen.  Be sure to take all of the medication as directed. Stop taking any medication if you develop a rash, tongue swelling or shortness of breath. Mothers who are breast feeding should consider pumping and discarding their breast milk while on these antibiotics. However, there is no consensus that infant exposure at these doses would be  harmful.  Remember that medication creams can weaken latex condoms. Marland Kitchen   HOME CARE:  Good hygiene may prevent some types of vaginosis from recurring and may relieve some symptoms:  . Avoid baths, hot tubs and whirlpool spas. Rinse soap from your outer genital area after a shower, and dry the area well to prevent irritation. Don't use scented or harsh soaps, such as those with deodorant or antibacterial action. Marland Kitchen Avoid irritants. These include scented tampons and pads. . Wipe from front to back after using the toilet. Doing so avoids spreading fecal bacteria to your vagina.  Other things that may help prevent vaginosis include:  Marland Kitchen Don't douche. Your vagina doesn't require cleansing other than normal bathing. Repetitive douching disrupts the normal organisms that reside in the vagina and can actually increase your risk of vaginal infection. Douching won't clear up a vaginal infection. . Use a latex condom. Both female and female latex condoms may help you avoid infections spread by sexual contact. . Wear cotton underwear. Also wear pantyhose with a cotton crotch. If you feel comfortable without it, skip wearing underwear to bed. Yeast thrives in [employer] Your symptoms should improve in the next day or two.  GET HELP RIGHT AWAY IF:  . You have pain in your lower abdomen ( pelvic area or over your ovaries) . You develop nausea or vomiting . You develop a fever . Your discharge changes or worsens . You have persistent pain with intercourse . You develop shortness of breath, a rapid pulse, or you faint.  These symptoms could be signs of problems or infections that need to  be evaluated by a medical provider now.  MAKE SURE YOU    Understand these instructions.  Will watch your condition.  Will get help right away if you are not doing well or get worse.  Your e-visit answers were reviewed by a board certified advanced clinical practitioner to complete your personal care plan.  Depending upon the condition, your plan could have included both over the counter or prescription medications. Please review your pharmacy choice to make sure that you have choses a pharmacy that is open for you to pick up any needed prescription, Your safety is important to us. If you have drug allergies check your prescription carefully.   You can use MyChart to ask questions about today's visit, request a non-urgent call back, or ask for a work or school excuse for 24 hours related to this e-Visit. If it has been greater than 24 hours you will need to follow up with your provider, or enter a new e-Visit to address those concerns. You will get a MyChart message within the next two days asking about your experience. I hope that your e-visit has been valuable and will speed your recovery.

## 2015-12-05 ENCOUNTER — Encounter: Payer: Self-pay | Admitting: Internal Medicine

## 2015-12-05 ENCOUNTER — Ambulatory Visit (INDEPENDENT_AMBULATORY_CARE_PROVIDER_SITE_OTHER): Payer: 59 | Admitting: Internal Medicine

## 2015-12-05 VITALS — BP 120/74 | HR 76 | Temp 97.6°F | Resp 12 | Ht 67.0 in | Wt 198.2 lb

## 2015-12-05 DIAGNOSIS — R05 Cough: Secondary | ICD-10-CM | POA: Diagnosis not present

## 2015-12-05 DIAGNOSIS — E559 Vitamin D deficiency, unspecified: Secondary | ICD-10-CM | POA: Diagnosis not present

## 2015-12-05 DIAGNOSIS — I10 Essential (primary) hypertension: Secondary | ICD-10-CM | POA: Diagnosis not present

## 2015-12-05 LAB — BASIC METABOLIC PANEL
BUN: 13 mg/dL (ref 6–23)
CALCIUM: 9.3 mg/dL (ref 8.4–10.5)
CO2: 29 mEq/L (ref 19–32)
Chloride: 104 mEq/L (ref 96–112)
Creatinine, Ser: 0.73 mg/dL (ref 0.40–1.20)
GFR: 90.56 mL/min (ref >60.00)
Glucose, Bld: 97 mg/dL (ref 70–99)
POTASSIUM: 4.5 meq/L (ref 3.5–5.1)
SODIUM: 138 meq/L (ref 135–145)

## 2015-12-05 MED ORDER — AZELASTINE HCL 0.1 % NA SOLN: 2.0000 | Freq: Every evening | NASAL | 3 refills | Status: DC | PRN

## 2015-12-05 NOTE — Patient Instructions (Addendum)
GO TO THE LAB : Get the blood work     GO TO THE FRONT DESK Schedule your next appointment for a  Physical by 05-2015, fasting  -------------------  Rest, fluids , tylenol  For cough:  Take Mucinex DM twice a day as needed until better  For nasal congestion: Use OTC Nasocort or Flonase : 2 nasal sprays on each side of the nose in the morning until you feel better Use ASTELIN a prescribed spray : 2 nasal sprays on each side of the nose at night until you feel better   Avoid decongestants such as  Pseudoephedrine or phenylephrine    Call if not gradually better over the next  10 days  Call anytime if the symptoms are severe

## 2015-12-05 NOTE — Progress Notes (Signed)
Pre visit review using our clinic review tool, if applicable. No additional management support is needed unless otherwise documented below in the visit note. 

## 2015-12-05 NOTE — Progress Notes (Signed)
Subjective:    Patient ID: Maria Graves, female    DOB: 11-14-1970, 45 y.o.   MRN: [id number]  DOS:  12/05/2015 Type of visit - description : Routine office visit Interval history: HTN: On lisinopril, good compliance. Developed a UTI, was Rx Macrobid, symptoms resolve, also had yeast infection. low vitamin D: Supplements, due for labs Having URI symptoms for about 4 weeks, currently has cough at night, postnasal dripping. No wheezing. Some throat congestion but no clear-cut stridor.   Review of Systems No fever or chills Still smoking, half pack a day  Past Medical History:  Diagnosis Date  . Contraception    husband vasectomy  . Essential tremor   . Gestational diabetes   . HTN (hypertension)   . MS (multiple sclerosis) (HCC)   . Proteinuria    Reports mild, chronic proteinuria    Past Surgical History:  Procedure Laterality Date  . TONSILLECTOMY  1991    Social History   Social History  . Marital status: Married    Spouse name: N/A  . Number of children: 2  . Years of education: N/A   Occupational History  . 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 Smoking status: Current Every Day Smoker  . Smokeless tobacco: Never Used     Comment: 4-5 cigarettes daily  . Alcohol use 0.0 oz/week     Comment: Socially  . Drug use: No  . Sexual activity: Not on file   Other Topics Concern  . Not on file   Social History Narrative   Moved from [place] July 2014   G3P2        Medication List       Accurate as of 12/05/15 11:59 PM. Always use your most recent med list.          azelastine 0.1 % nasal spray Commonly known as:  ASTELIN Place 2 sprays into both nostrils at bedtime as needed for rhinitis. Use in each nostril as directed   baclofen 20 MG tablet Commonly known as:  LIORESAL Take 20 mg by mouth 3 (three) times daily as needed.   CLARITIN PO Take 1 tablet by mouth daily as needed. [facility] Brand OTC   lisinopril  20 MG tablet Commonly known as:  PRINIVIL,ZESTRIL Take 1 tablet (20 mg total) by mouth daily.   PROBIOTIC DAILY PO Take 1 tablet by mouth daily.          Objective:   Physical Exam BP 120/74 (BP Location: Left Arm, Patient Position: Sitting, Cuff Size: Normal)   Pulse 76   Temp 97.6 F (36.4 C) (Oral)   Resp 12   Ht 5\' 7"  (1.702 m)   Wt 198 lb 4 oz (89.9 kg)   LMP 11/14/2015 (Approximate)   SpO2 96%   BMI 31.05 kg/m  General:   Well developed, well nourished . NAD.  HEENT:  Normocephalic . Face symmetric, atraumatic. TMs normal, throat symmetric without redness or discharge. Nose is slightly congested, sinuses no TTP. No stridor Lungs:  No wheezing, few rhonchi with cough only. Normal respiratory effort, no intercostal retractions, no accessory muscle use. Heart: RRR,  no murmur.  No pretibial edema bilaterally  Skin: Not pale. Not jaundice Neurologic:  alert & oriented X3.  Speech normal, gait appropriate for age and unassisted Psych--  Cognition and judgment appear intact.  Cooperative with normal attention span and concentration.  Behavior appropriate. No anxious or depressed  appearing.      Assessment & Plan:   Assessment> A1C 5.8 Multiple sclerosis: neuro @ [facility] Essential tremor HTN Pulmonary nodule, last CT 05-2015, stable, no further scans Gestational diabetes Proteinuria: Mild, chronic Contraception: Husband vasectomy  PLAN: HTN: On lisinopril, check a BMP. BP is very good Gestational diabetes: Last A1c 5.8 (05-2015). Vitamin D deficiency: Had ergocalciferol, currently on vitamin D OTC around 2000 units daily. Checking labs Cough: For the last few weeks, no fever chills, patient is a smoker and is on lisinopril. She complains of postnasal dripping. Treat with Mucinex DM, Astelin, Flonase. Will call if no better. Antibiotics?  atypical infection?. RTC 05-2016, CPX

## 2015-12-06 NOTE — Assessment & Plan Note (Signed)
HTN: On lisinopril, check a BMP. BP is very good Gestational diabetes: Last A1c 5.8 (05-2015). Vitamin D deficiency: Had ergocalciferol, currently on vitamin D OTC around 2000 units daily. Checking labs Cough: For the last few weeks, no fever chills, patient is a smoker and is on lisinopril. She complains of postnasal dripping. Treat with Mucinex DM, Astelin, Flonase. Will call if no better. Antibiotics?  atypical infection?. RTC 05-2016, CPX

## 2015-12-07 LAB — VITAMIN D 1,25 DIHYDROXY
Vitamin D 1, 25 (OH)2 Total: 29 pg/mL (ref 18–72)
Vitamin D3 1, 25 (OH)2: 29 pg/mL

## 2016-01-13 ENCOUNTER — Other Ambulatory Visit: Payer: Self-pay | Admitting: Internal Medicine

## 2016-02-09 ENCOUNTER — Other Ambulatory Visit: Payer: Self-pay | Admitting: Obstetrics & Gynecology

## 2016-02-09 DIAGNOSIS — Z1231 Encounter for screening mammogram for malignant neoplasm of breast: Secondary | ICD-10-CM

## 2016-03-09 ENCOUNTER — Ambulatory Visit
Admission: RE | Admit: 2016-03-09 | Discharge: 2016-03-09 | Disposition: A | Discharge status: Home / Self Care | Payer: 59 | Source: Ambulatory Visit | Attending: Obstetrics & Gynecology | Admitting: Obstetrics & Gynecology

## 2016-03-09 DIAGNOSIS — Z1231 Encounter for screening mammogram for malignant neoplasm of breast: Secondary | ICD-10-CM

## 2016-03-13 ENCOUNTER — Other Ambulatory Visit: Payer: Self-pay | Admitting: Obstetrics & Gynecology

## 2016-03-13 DIAGNOSIS — R928 Other abnormal and inconclusive findings on diagnostic imaging of breast: Secondary | ICD-10-CM

## 2016-03-16 ENCOUNTER — Ambulatory Visit
Admission: RE | Admit: 2016-03-16 | Discharge: 2016-03-16 | Disposition: A | Discharge status: Home / Self Care | Payer: 59 | Source: Ambulatory Visit | Attending: Obstetrics & Gynecology | Admitting: Obstetrics & Gynecology

## 2016-03-16 DIAGNOSIS — R928 Other abnormal and inconclusive findings on diagnostic imaging of breast: Secondary | ICD-10-CM

## 2016-06-01 ENCOUNTER — Other Ambulatory Visit: Payer: Self-pay

## 2016-06-04 ENCOUNTER — Encounter: Payer: Self-pay | Admitting: Internal Medicine

## 2016-06-04 ENCOUNTER — Ambulatory Visit (INDEPENDENT_AMBULATORY_CARE_PROVIDER_SITE_OTHER): Payer: 59 | Admitting: Internal Medicine

## 2016-06-04 VITALS — BP 134/84 | HR 71 | Temp 97.6°F | Resp 14 | Ht 67.0 in | Wt 196.0 lb

## 2016-06-04 DIAGNOSIS — Z0000 Encounter for general adult medical examination without abnormal findings: Secondary | ICD-10-CM | POA: Diagnosis not present

## 2016-06-04 DIAGNOSIS — Z114 Encounter for screening for human immunodeficiency virus [HIV]: Secondary | ICD-10-CM

## 2016-06-04 LAB — COMPREHENSIVE METABOLIC PANEL
ALK PHOS: 40 U/L (ref 39–117)
ALT: 16 U/L (ref 0–35)
AST: 11 U/L (ref 0–37)
Albumin: 4.4 g/dL (ref 3.5–5.2)
BILIRUBIN TOTAL: 0.4 mg/dL (ref 0.2–1.2)
BUN: 13 mg/dL (ref 6–23)
CO2: 28 mEq/L (ref 19–32)
Calcium: 9.4 mg/dL (ref 8.4–10.5)
Chloride: 103 mEq/L (ref 96–112)
Creatinine, Ser: 0.75 mg/dL (ref 0.40–1.20)
GFR: 87.59 mL/min (ref >60.00)
GLUCOSE: 99 mg/dL (ref 70–99)
Potassium: 4.3 mEq/L (ref 3.5–5.1)
SODIUM: 136 meq/L (ref 135–145)
TOTAL PROTEIN: 7.2 g/dL (ref 6.0–8.3)

## 2016-06-04 LAB — CBC WITH DIFFERENTIAL/PLATELET
BASOS ABS: 0.1 10*3/uL (ref 0.0–0.1)
Basophils Relative: 1.1 % (ref 0.0–3.0)
EOS PCT: 2.1 % (ref 0.0–5.0)
Eosinophils Absolute: 0.2 10*3/uL (ref 0.0–0.7)
HCT: 45.7 % (ref 36.0–46.0)
HEMOGLOBIN: 15 g/dL (ref 12.0–15.0)
Lymphocytes Relative: 27.3 % (ref 12.0–46.0)
Lymphs Abs: 2.2 10*3/uL (ref 0.7–4.0)
MCHC: 32.9 g/dL (ref 30.0–36.0)
MCV: 86.6 fl (ref 78.0–100.0)
MONO ABS: 0.5 10*3/uL (ref 0.1–1.0)
MONOS PCT: 5.7 % (ref 3.0–12.0)
Neutro Abs: 5.1 10*3/uL (ref 1.4–7.7)
Neutrophils Relative %: 63.8 % (ref 43.0–77.0)
Platelets: 250 10*3/uL (ref 150.0–400.0)
RBC: 5.28 Mil/uL — AB (ref 3.87–5.11)
RDW: 14 % (ref 11.5–15.5)
WBC: 8 10*3/uL (ref 4.0–10.5)

## 2016-06-04 LAB — LIPID PANEL
Cholesterol: 185 mg/dL (ref 0–200)
HDL: 53.8 mg/dL (ref >39.00)
LDL Cholesterol: 110 mg/dL — ABNORMAL HIGH (ref 0–99)
NONHDL: 131.05
Total CHOL/HDL Ratio: 3
Triglycerides: 106 mg/dL (ref 0.0–149.0)
VLDL: 21.2 mg/dL (ref 0.0–40.0)

## 2016-06-04 LAB — TSH: TSH: 0.97 u[IU]/mL (ref 0.35–4.50)

## 2016-06-04 LAB — HEMOGLOBIN A1C: HEMOGLOBIN A1C: 5.6 % (ref 4.6–6.5)

## 2016-06-04 LAB — HIV ANTIBODY (ROUTINE TESTING W REFLEX): HIV 1&2 Ab, 4th Generation: NONREACTIVE

## 2016-06-04 NOTE — Patient Instructions (Signed)
GO TO THE LAB : Get the blood work     GO TO THE FRONT DESK Schedule your next appointment for a  physical exam in one year  

## 2016-06-04 NOTE — Progress Notes (Signed)
Subjective:    Patient ID: Maria Graves, female    DOB: [birth year], 46 y.o.   MRN: [id number]  DOS:  06/04/2016 Type of visit - description : cpx Interval history: In general feeling well. Went to see her neurologist for a MS f/u doing great, due for a MRI   Review of Systems Complain of occasional pain at the knuckles and fingers, they look puffy sometimes but no red or hot. No other unusual aches and pains, no fever chills or headaches.  Other than above, a 14 point review of systems is negative     Past Medical History:  Diagnosis Date  . Contraception    husband vasectomy  . Essential tremor   . Gestational diabetes   . HTN (hypertension)   . MS (multiple sclerosis) (HCC)   . Proteinuria    Reports mild, chronic proteinuria    Past Surgical History:  Procedure Laterality Date  . TONSILLECTOMY  1991    Social History   Social History  . Marital status: Married    Spouse name: N/A  . Number of children: 2  . Years of education: N/A   Occupational History  . 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 Smoking status: Current Every Day Smoker  . Smokeless tobacco: Never Used     Comment: 1/2 ppd  . Alcohol use 0.0 oz/week     Comment: Socially  . Drug use: No  . Sexual activity: Not on file   Other Topics Concern  . Not on file   Social History Narrative   Moved from [place] July 2014   G3P2     Family History  Problem Relation Age of Onset  . Arthritis Mother   . Alcohol abuse Mother   . Hypertension Brother   . Heart disease Paternal Grandfather   . Heart attack Paternal Grandfather   . Stroke Paternal Grandmother   . Diabetes Father   . Clotting disorder Father   . Diabetes Maternal Grandfather   . Sudden death Other     uncle, GF  . Colon cancer Neg Hx   . Breast cancer Neg Hx      Allergies as of 06/04/2016      Reactions   Erythromycin Base Nausea Only   Sulfa Antibiotics Hives   Bactrim caused Hives     Tetanus Toxoids Swelling   As a child      Medication List       Accurate as of 06/04/16 11:59 PM. Always use your most recent med list.          fluticasone 50 MCG/ACT nasal spray Commonly known as:  FLONASE Place 2 sprays into both nostrils daily.   lisinopril 20 MG tablet Commonly known as:  PRINIVIL,ZESTRIL Take 1 tablet (20 mg total) by mouth daily.   PROBIOTIC DAILY PO Take 1 tablet by mouth daily.   Vitamin D3 2000 units capsule Take 4,000 Units by mouth daily.          Objective:   Physical Exam BP 134/84 (BP Location: Left Arm, Patient Position: Sitting, Cuff Size: Normal)   Pulse 71   Temp 97.6 F (36.4 C) (Oral)   Resp 14   Ht 5\' 7"  (1.702 m)   Wt 196 lb (88.9 kg)   LMP 05/14/2016 (Approximate)   SpO2 97%   BMI 30.70 kg/m   General:   Well developed, well nourished . NAD.  Neck: No  thyromegaly  HEENT:  Normocephalic . Face symmetric, atraumatic Lungs:  CTA B Normal respiratory effort, no intercostal retractions, no accessory muscle use. Heart: RRR,  no murmur.  No pretibial edema bilaterally  Abdomen:  Not distended, soft, non-tender. No rebound or rigidity.   Skin: Exposed areas without rash. Not pale. Not jaundice MSK: Hands and wrists without synovitis or major problems on exam Neurologic:  alert & oriented X3.  Speech normal, gait appropriate for age and unassisted Strength symmetric and appropriate for age.  Psych: Cognition and judgment appear intact.  Cooperative with normal attention span and concentration.  Behavior appropriate. No anxious or depressed appearing.    Assessment & Plan:   Assessment A1C 5.8 Multiple sclerosis: neuro @ [facility] Essential tremor HTN Pulmonary nodule, last CT 05-2015, stable, no further scans Gestational diabetes Proteinuria: Mild, chronic Contraception: Husband vasectomy  PLAN: A1c last year was 5.8. Recheck them.  HTN: Continue lisinopril, checking labs MS: Seems to be doing very well, not  taking any medication. Was recommend a high-dose of vitamin D daily, round 4000 units qd. Asked for a vitamin D level check. Arthralgias : No evidence of synovitis on exam, likely DJD. Observation RTC oneyear

## 2016-06-04 NOTE — Assessment & Plan Note (Signed)
--  Td -- intolerant , ++ swelling  --Female care per Gyn Dr Laurie Panda;  Updated on PAPs-MMGs  --Never had a cscope  --Labs: CMP FLP CBC TSH A1c vitamin D. Also HIV screening --Tobacco: counseled, she is willing to use nicotine supplements but not medications by mouth.

## 2016-06-04 NOTE — Progress Notes (Signed)
Pre visit review using our clinic review tool, if applicable. No additional management support is needed unless otherwise documented below in the visit note. 

## 2016-06-05 NOTE — Assessment & Plan Note (Signed)
A1c last year was 5.8. Recheck them.  HTN: Continue lisinopril, checking labs MS: Seems to be doing very well, not taking any medication. Was recommend a high-dose of vitamin D daily, round 4000 units qd. Asked for a vitamin D level check. Arthralgias : No evidence of synovitis on exam, likely DJD. Observation RTC oneyear

## 2016-06-07 LAB — VITAMIN D 1,25 DIHYDROXY
VITAMIN D 1, 25 (OH) TOTAL: 47 pg/mL (ref 18–72)
VITAMIN D3 1, 25 (OH): 47 pg/mL

## 2016-08-03 ENCOUNTER — Other Ambulatory Visit: Payer: Self-pay | Admitting: Internal Medicine

## 2016-08-30 ENCOUNTER — Emergency Department (HOSPITAL_BASED_OUTPATIENT_CLINIC_OR_DEPARTMENT_OTHER): Payer: 59

## 2016-08-30 ENCOUNTER — Encounter (HOSPITAL_BASED_OUTPATIENT_CLINIC_OR_DEPARTMENT_OTHER): Payer: Self-pay | Admitting: *Deleted

## 2016-08-30 ENCOUNTER — Emergency Department (HOSPITAL_BASED_OUTPATIENT_CLINIC_OR_DEPARTMENT_OTHER)
Admission: EM | Admit: 2016-08-30 | Discharge: 2016-08-30 | Disposition: A | Discharge status: Home / Self Care | Payer: 59 | Attending: Emergency Medicine | Admitting: Emergency Medicine

## 2016-08-30 DIAGNOSIS — Z79899 Other long term (current) drug therapy: Secondary | ICD-10-CM | POA: Insufficient documentation

## 2016-08-30 DIAGNOSIS — K529 Noninfective gastroenteritis and colitis, unspecified: Secondary | ICD-10-CM | POA: Insufficient documentation

## 2016-08-30 DIAGNOSIS — R1084 Generalized abdominal pain: Secondary | ICD-10-CM | POA: Diagnosis present

## 2016-08-30 DIAGNOSIS — F17200 Nicotine dependence, unspecified, uncomplicated: Secondary | ICD-10-CM | POA: Diagnosis not present

## 2016-08-30 LAB — COMPREHENSIVE METABOLIC PANEL
ALT: 19 U/L (ref 14–54)
AST: 20 U/L (ref 15–41)
Albumin: 4.6 g/dL (ref 3.5–5.0)
Alkaline Phosphatase: 59 U/L (ref 38–126)
Anion gap: 13 (ref 5–15)
BILIRUBIN TOTAL: 0.5 mg/dL (ref 0.3–1.2)
BUN: 15 mg/dL (ref 6–20)
CALCIUM: 9.6 mg/dL (ref 8.9–10.3)
CO2: 24 mmol/L (ref 22–32)
CREATININE: 0.89 mg/dL (ref 0.44–1.00)
Chloride: 100 mmol/L — ABNORMAL LOW (ref 101–111)
Glucose, Bld: 166 mg/dL — ABNORMAL HIGH (ref 65–99)
Potassium: 3.8 mmol/L (ref 3.5–5.1)
Sodium: 137 mmol/L (ref 135–145)
TOTAL PROTEIN: 7.9 g/dL (ref 6.5–8.1)

## 2016-08-30 LAB — CBC WITH DIFFERENTIAL/PLATELET
Basophils Absolute: 0.1 10*3/uL (ref 0.0–0.1)
Basophils Relative: 0 %
EOS PCT: 1 %
Eosinophils Absolute: 0.1 10*3/uL (ref 0.0–0.7)
HEMATOCRIT: 49.9 % — AB (ref 36.0–46.0)
Hemoglobin: 17 g/dL — ABNORMAL HIGH (ref 12.0–15.0)
LYMPHS PCT: 11 %
Lymphs Abs: 2.2 10*3/uL (ref 0.7–4.0)
MCH: 28.3 pg (ref 26.0–34.0)
MCHC: 34.1 g/dL (ref 30.0–36.0)
MCV: 83 fL (ref 78.0–100.0)
MONO ABS: 0.7 10*3/uL (ref 0.1–1.0)
MONOS PCT: 4 %
NEUTROS ABS: 16.4 10*3/uL — AB (ref 1.7–7.7)
Neutrophils Relative %: 84 %
PLATELETS: 325 10*3/uL (ref 150–400)
RBC: 6.01 MIL/uL — ABNORMAL HIGH (ref 3.87–5.11)
RDW: 14.2 % (ref 11.5–15.5)
WBC: 19.5 10*3/uL — ABNORMAL HIGH (ref 4.0–10.5)

## 2016-08-30 LAB — URINALYSIS, ROUTINE W REFLEX MICROSCOPIC
BILIRUBIN URINE: NEGATIVE
GLUCOSE, UA: NEGATIVE mg/dL
KETONES UR: 15 mg/dL — AB
Leukocytes, UA: NEGATIVE
Nitrite: NEGATIVE
PROTEIN: 30 mg/dL — AB
Specific Gravity, Urine: 1.025 (ref 1.005–1.030)
pH: 7.5 (ref 5.0–8.0)

## 2016-08-30 LAB — URINALYSIS, MICROSCOPIC (REFLEX)

## 2016-08-30 LAB — LIPASE, BLOOD: LIPASE: 22 U/L (ref 11–51)

## 2016-08-30 LAB — PREGNANCY, URINE: PREG TEST UR: NEGATIVE

## 2016-08-30 MED ORDER — SODIUM CHLORIDE 0.9 % IV BOLUS (SEPSIS)
1000.0000 mL | Freq: Once | INTRAVENOUS | Status: AC
  Administered 2016-08-30: 1000 mL via INTRAVENOUS

## 2016-08-30 MED ORDER — DICYCLOMINE HCL 20 MG PO TABS: 20.0000 mg | ORAL_TABLET | Freq: Four times a day (QID) | ORAL | 0 refills | Status: DC | PRN

## 2016-08-30 MED ORDER — ONDANSETRON HCL 4 MG/2ML IJ SOLN
4.0000 mg | Freq: Once | INTRAMUSCULAR | Status: AC
  Administered 2016-08-30: 4 mg via INTRAVENOUS
  Filled 2016-08-30: qty 2

## 2016-08-30 MED ORDER — ONDANSETRON 8 MG PO TBDP: 8.0000 mg | ORAL_TABLET | Freq: Three times a day (TID) | ORAL | 0 refills | Status: DC | PRN

## 2016-08-30 MED ORDER — FENTANYL CITRATE (PF) 100 MCG/2ML IJ SOLN
100.0000 ug | Freq: Once | INTRAMUSCULAR | Status: AC
  Administered 2016-08-30: 100 ug via INTRAVENOUS
  Filled 2016-08-30: qty 2

## 2016-08-30 MED ORDER — IOPAMIDOL (ISOVUE-300) INJECTION 61%
100.0000 mL | Freq: Once | INTRAVENOUS | Status: AC | PRN
  Administered 2016-08-30: 100 mL via INTRAVENOUS

## 2016-08-30 NOTE — ED Provider Notes (Signed)
[facility] Provider Note: Maria Dell, MD, FACEP  CSN: [id number] MRN: [id number] ARRIVAL: 08/30/16 at 0431 ROOM: [facility]01/[facility]01   CHIEF COMPLAINT  Abdominal Pain   HISTORY OF PRESENT ILLNESS  08/30/16 4:52 AM Maria Graves is a 46 y.o. female with a history of multiple sclerosis not currently on any immunosuppressive drugs. She is here with nausea, vomiting, fever, chills and abdominal pain that began about 8 PM yesterday evening. She has had multiple episodes of emesis to the point that she is now mostly retching. Her abdominal pain has both a generalized dull component as well as a sharp, intermittent, right lower quadrant component. She rates her pain as an 8 out of 10. It is worse with movement or palpation. She also feels that her abdomen is bloated.   Past Medical History:  Diagnosis Date  . Contraception    husband vasectomy  . Essential tremor   . Gestational diabetes   . HTN (hypertension)   . MS (multiple sclerosis) (HCC)   . Proteinuria    Reports mild, chronic proteinuria    Past Surgical History:  Procedure Laterality Date  . TONSILLECTOMY  1991    Family History  Problem Relation Age of Onset  . Arthritis Mother   . Alcohol abuse Mother   . Hypertension Brother   . Heart disease Paternal Grandfather   . Heart attack Paternal Grandfather   . Stroke Paternal Grandmother   . Diabetes Father   . Clotting disorder Father   . Diabetes Maternal Grandfather   . Sudden death Other        uncle, GF  . Colon cancer Neg Hx   . Breast cancer Neg Hx     Social History  Substance Use Topics  . Smoking status: Current Every Day Smoker  . Smokeless tobacco: Never Used     Comment: 1/2 ppd  . Alcohol use 0.0 oz/week     Comment: Socially    Prior to Admission medications   Medication Sig Start Date End Date Taking? Authorizing Provider  Cholecalciferol (VITAMIN D3) 2000 units capsule Take 4,000 Units by mouth daily.    [provider]    lisinopril (PRINIVIL,ZESTRIL) 20 MG tablet Take 1 tablet (20 mg total) by mouth daily. 08/03/16   Wanda Plump, MD  Probiotic Product (PROBIOTIC DAILY PO) Take 1 tablet by mouth daily.    [provider]    Allergies Erythromycin base; Sulfa antibiotics; and Tetanus toxoids   REVIEW OF SYSTEMS  Negative except as noted here or in the History of Present Illness.   PHYSICAL EXAMINATION  Initial Vital Signs Blood pressure (!) 155/84, temperature (!) 97.5 F (36.4 C), resp. rate 20, height 5\' 8"  (1.727 m), weight 90.7 kg (200 lb), SpO2 100 %.  Examination General: Well-developed, well-nourished female in no acute distress; appearance consistent with age of record HENT: normocephalic; atraumatic Eyes: pupils equal, round and reactive to light; extraocular muscles intact Neck: supple Heart: regular rate and rhythm Lungs: clear to auscultation bilaterally Abdomen: soft; nondistended; generalized abdominal tenderness; no masses or hepatosplenomegaly; bowel sounds present GU: Bilateral CVA tenderness Extremities: No deformity; full range of motion; pulses normal Neurologic: Awake, alert and oriented; motor function intact in all extremities and symmetric; no facial droop Skin: Warm and dry Psychiatric: Flat affect   RESULTS  Summary of this visit's results, reviewed by myself:   EKG Interpretation  Date/Time:    Ventricular Rate:    PR Interval:    QRS Duration:  QT Interval:    QTC Calculation:   R Axis:     Text Interpretation:        Laboratory Studies: Results for orders placed or performed during the hospital encounter of 08/30/16 (from the past 24 hour(s))  CBC with Differential     Status: Abnormal   Collection Time: 08/30/16  4:53 AM  Result Value Ref Range   WBC 19.5 (H) 4.0 - 10.5 K/uL   RBC 6.01 (H) 3.87 - 5.11 MIL/uL   Hemoglobin 17.0 (H) 12.0 - 15.0 g/dL   HCT 91.4 (H) 78.2 - 95.6 %   MCV 83.0 78.0 - 100.0 fL   MCH 28.3 26.0 - 34.0 pg   MCHC  34.1 30.0 - 36.0 g/dL   RDW 21.3 08.6 - 57.8 %   Platelets 325 150 - 400 K/uL   Neutrophils Relative % 84 %   Neutro Abs 16.4 (H) 1.7 - 7.7 K/uL   Lymphocytes Relative 11 %   Lymphs Abs 2.2 0.7 - 4.0 K/uL   Monocytes Relative 4 %   Monocytes Absolute 0.7 0.1 - 1.0 K/uL   Eosinophils Relative 1 %   Eosinophils Absolute 0.1 0.0 - 0.7 K/uL   Basophils Relative 0 %   Basophils Absolute 0.1 0.0 - 0.1 K/uL  Comprehensive metabolic panel     Status: Abnormal   Collection Time: 08/30/16  4:53 AM  Result Value Ref Range   Sodium 137 135 - 145 mmol/L   Potassium 3.8 3.5 - 5.1 mmol/L   Chloride 100 (L) 101 - 111 mmol/L   CO2 24 22 - 32 mmol/L   Glucose, Bld 166 (H) 65 - 99 mg/dL   BUN 15 6 - 20 mg/dL   Creatinine, Ser 4.69 0.44 - 1.00 mg/dL   Calcium 9.6 8.9 - 62.9 mg/dL   Total Protein 7.9 6.5 - 8.1 g/dL   Albumin 4.6 3.5 - 5.0 g/dL   AST 20 15 - 41 U/L   ALT 19 14 - 54 U/L   Alkaline Phosphatase 59 38 - 126 U/L   Total Bilirubin 0.5 0.3 - 1.2 mg/dL   GFR calc non Af Amer >60 >60 mL/min   GFR calc Af Amer >60 >60 mL/min   Anion gap 13 5 - 15  Lipase, blood     Status: None   Collection Time: 08/30/16  4:53 AM  Result Value Ref Range   Lipase 22 11 - 51 U/L  Urinalysis, Routine w reflex microscopic     Status: Abnormal   Collection Time: 08/30/16  5:55 AM  Result Value Ref Range   Color, Urine YELLOW YELLOW   APPearance CLOUDY (A) CLEAR   Specific Gravity, Urine 1.025 1.005 - 1.030   pH 7.5 5.0 - 8.0   Glucose, UA NEGATIVE NEGATIVE mg/dL   Hgb urine dipstick TRACE (A) NEGATIVE   Bilirubin Urine NEGATIVE NEGATIVE   Ketones, ur 15 (A) NEGATIVE mg/dL   Protein, ur 30 (A) NEGATIVE mg/dL   Nitrite NEGATIVE NEGATIVE   Leukocytes, UA NEGATIVE NEGATIVE  Pregnancy, urine     Status: None   Collection Time: 08/30/16  5:55 AM  Result Value Ref Range   Preg Test, Ur NEGATIVE NEGATIVE  Urinalysis, Microscopic (reflex)     Status: Abnormal   Collection Time: 08/30/16  5:55 AM  Result  Value Ref Range   RBC / HPF 0-5 0 - 5 RBC/hpf   WBC, UA 0-5 0 - 5 WBC/hpf   Bacteria, UA FEW (A) NONE SEEN  Squamous Epithelial / LPF 0-5 (A) NONE SEEN   Mucous PRESENT    Hyaline Casts, UA PRESENT    Granular Casts, UA PRESENT    Imaging Studies: Ct Abdomen Pelvis W Contrast  Result Date: 08/30/2016 CLINICAL DATA:  Initial evaluation for acute mid right abdominal pain, nausea, vomiting, diarrhea. EXAM: CT ABDOMEN AND PELVIS WITH CONTRAST TECHNIQUE: Multidetector CT imaging of the abdomen and pelvis was performed using the standard protocol following bolus administration of intravenous contrast. CONTRAST:  ISOVUE-300 IOPAMIDOL (ISOVUE-300) INJECTION 61% COMPARISON:  Prior CT from 08/11/2012. FINDINGS: Lower chest: 7 mm nodular density at the left lung base, similar to previous, likely benign given stability. Visualized lung bases are otherwise clear. Hepatobiliary: Few scattered hypodensities noted within the peripheral right hepatic lobe, likely small cyst. Liver otherwise unremarkable. Gallbladder within normal limits. No biliary dilatation. Pancreas: Pancreas within normal limits. Spleen: Spleen within normal limits. Adrenals/Urinary Tract: Adrenal glands are normal. Kidneys equal in size with symmetric enhancement. No nephrolithiasis, hydronephrosis, or focal enhancing renal mass. No hydroureter. Bladder largely decompressed without acute abnormality. Stomach/Bowel: Stomach within normal limits. Multiple prominent fluid-filled loops of small bowel seen scattered throughout the abdomen, measuring up to 2.6 cm in diameter. These mildly prominent loops extend almost to the level of terminal ileum, without definite discrete transition coin. Findings favored to reflect an acute enteritis, which may be either infectious or inflammatory nature. Distally, the colon is essentially diffusely decompressed without acute inflammatory changes. Appendix is within normal limits. Vascular/Lymphatic: Mild  atheromatous plaque within the infrarenal aorta. No aneurysm. Normal intravascular enhancement seen throughout the intra-abdominal aorta and its branch vessels. No pathologically enlarged intra-abdominal or pelvic lymph nodes. Reproductive: Uterus within normal limits. 2.6 cm cyst within left ovary, likely a normal physiologic cyst. Additional 2.2 cm right ovarian cyst also likely physiologic. Other: Small moderate free fluid within the abdomen and pelvis, likely reactive. No free intraperitoneal air. Musculoskeletal: No acute osseus abnormality. No worrisome lytic or blastic osseous lesions. IMPRESSION: 1. Multiple prominent fluid-filled loops of small bowel scattered throughout the abdomen, most suggestive of an acute enteritis. This may be either infectious or inflammatory in nature. 2. Small to moderate free fluid within the abdomen and pelvis, likely reactive. Electronically Signed   By: Rise Mu M.D.   On: 08/30/2016 06:55    ED COURSE  Nursing notes and initial vitals signs, including pulse oximetry, reviewed.  Vitals:   08/30/16 0441 08/30/16 0442 08/30/16 0445 08/30/16 0606  BP: (!) 155/84     Resp: 20     Temp:   (!) 97.5 F (36.4 C) 97.6 F (36.4 C)  TempSrc:    Oral  SpO2: 100%     Weight:  90.7 kg (200 lb)    Height:  5\' 8"  (1.727 m)     7:01 AM Patient's pain and nausea well controlled. Patient's history and CT findings are consistent with an acute gastroenteritis. We will treat her with Bentyl and Zofran and have her return if symptoms change or worsen.  PROCEDURES    ED DIAGNOSES     ICD-10-CM   1. Gastroenteritis K52.9        Florella Mcneese, MD 08/30/16 [phone]

## 2016-08-30 NOTE — ED Triage Notes (Signed)
C/o rt mid abd pain  Comes in waves w n/v chills onset at 2000 last pm

## 2016-09-27 LAB — HM HEPATITIS C SCREENING LAB: HM Hepatitis Screen: NEGATIVE

## 2017-02-28 ENCOUNTER — Other Ambulatory Visit: Payer: Self-pay | Admitting: Obstetrics & Gynecology

## 2017-02-28 DIAGNOSIS — N6320 Unspecified lump in the left breast, unspecified quadrant: Secondary | ICD-10-CM

## 2017-03-04 ENCOUNTER — Encounter: Payer: Self-pay | Admitting: Obstetrics & Gynecology

## 2017-03-12 ENCOUNTER — Ambulatory Visit
Admission: RE | Admit: 2017-03-12 | Discharge: 2017-03-12 | Disposition: A | Discharge status: Home / Self Care | Payer: 59 | Source: Ambulatory Visit | Attending: Obstetrics & Gynecology | Admitting: Obstetrics & Gynecology

## 2017-03-12 ENCOUNTER — Ambulatory Visit
Admission: RE | Admit: 2017-03-12 | Discharge: 2017-03-12 | Disposition: A | Discharge status: Home / Self Care | Payer: Managed Care, Other (non HMO) | Source: Ambulatory Visit | Attending: Obstetrics & Gynecology | Admitting: Obstetrics & Gynecology

## 2017-03-12 DIAGNOSIS — N6320 Unspecified lump in the left breast, unspecified quadrant: Secondary | ICD-10-CM

## 2017-03-14 ENCOUNTER — Ambulatory Visit (INDEPENDENT_AMBULATORY_CARE_PROVIDER_SITE_OTHER): Payer: Managed Care, Other (non HMO) | Admitting: Obstetrics & Gynecology

## 2017-03-14 ENCOUNTER — Encounter: Payer: Self-pay | Admitting: Obstetrics & Gynecology

## 2017-03-14 VITALS — BP 140/84 | Ht 66.0 in | Wt 200.0 lb

## 2017-03-14 DIAGNOSIS — Z01419 Encounter for gynecological examination (general) (routine) without abnormal findings: Secondary | ICD-10-CM | POA: Diagnosis not present

## 2017-03-14 DIAGNOSIS — G35 Multiple sclerosis: Secondary | ICD-10-CM

## 2017-03-14 DIAGNOSIS — Z9189 Other specified personal risk factors, not elsewhere classified: Secondary | ICD-10-CM

## 2017-03-14 NOTE — Progress Notes (Signed)
Maria Graves 05/13/1970 [id number]   History:    47 y.o. G3P2 Married.  Vasectomy.  2 sons, oldest is 47 yo.  RP:  Established patient presenting for annual gyn exam   HPI: Menses every 4-5 weeks, normal flow.  No pelvic pain.  No pain with IC.  Urine/BMs wnl.  Breasts wnl.  MS which relapsed in 10/2016.  On Ocrevus since then.    Past medical history,surgical history, family history and social history were all reviewed and documented in the EPIC chart.  Gynecologic History Patient's last menstrual period was 02/07/2017. Contraception: vasectomy Last Pap: 2018. Results were: normal per patient (Will obtain medical records from [facility]) Last mammogram: 03/2017. Results were: Probably benign.  Will repeat Bilateral Dx mammo/Left Breast US in 1 year. Bone Density: Never Colonoscopy: Never  Obstetric History OB History  Gravida Para Term Preterm AB Living  3 2     1 2   SAB TAB Ectopic Multiple Live Births               # Outcome Date GA Lbr Len/2nd Weight Sex Delivery Anes PTL Lv  3 AB           2 Para           1 Para                ROS: A ROS was performed and pertinent positives and negatives are included in the history.  GENERAL: No fevers or chills. HEENT: No change in vision, no earache, sore throat or sinus congestion. NECK: No pain or stiffness. CARDIOVASCULAR: No chest pain or pressure. No palpitations. PULMONARY: No shortness of breath, cough or wheeze. GASTROINTESTINAL: No abdominal pain, nausea, vomiting or diarrhea, melena or bright red blood per rectum. GENITOURINARY: No urinary frequency, urgency, hesitancy or dysuria. MUSCULOSKELETAL: No joint or muscle pain, no back pain, no recent trauma. DERMATOLOGIC: No rash, no itching, no lesions. ENDOCRINE: No polyuria, polydipsia, no heat or cold intolerance. No recent change in weight. HEMATOLOGICAL: No anemia or easy bruising or bleeding. NEUROLOGIC: No headache, seizures, numbness, tingling or weakness. PSYCHIATRIC:  No depression, no loss of interest in normal activity or change in sleep pattern.     Exam:   BP 140/84   Ht 5\' 6"  (1.676 m)   Wt 200 lb (90.7 kg)   LMP 02/07/2017 Comment: vasectomy   BMI 32.28 kg/m   Body mass index is 32.28 kg/m.  General appearance : Well developed well nourished female. No acute distress HEENT: Eyes: no retinal hemorrhage or exudates,  Neck supple, trachea midline, no carotid bruits, no thyroidmegaly Lungs: Clear to auscultation, no rhonchi or wheezes, or rib retractions  Heart: Regular rate and rhythm, no murmurs or gallops Breast:Examined in sitting and supine position were symmetrical in appearance, no palpable masses or tenderness,  no skin retraction, no nipple inversion, no nipple discharge, no skin discoloration, no axillary or supraclavicular lymphadenopathy Abdomen: no palpable masses or tenderness, no rebound or guarding Extremities: no edema or skin discoloration or tenderness  Pelvic: Vulva: Normal             Vagina: No gross lesions or discharge  Cervix: No gross lesions or discharge.  Pap reflex done  Uterus  AV, normal size, shape and consistency, non-tender and mobile  Adnexa  Without masses or tenderness  Anus: Normal   Assessment/Plan:  47 y.o. female for annual exam   1. Encounter for routine gynecological examination with Papanicolaou smear of cervix Normal  gynecologic exam.  Pap reflex done.  Breast exam normal.  Last mammogram and breast ultrasound were: "Probably benign".  Recommended to repeat bilateral diagnostic mammogram and breast ultrasound in 1 year.  Recommend regular aerobic physical activity 5 times a week with weight lifting every 2 days. - Pap IG w/ reflex to HPV when ASC-U  2. Relies on partner vasectomy for contraception  3. Multiple sclerosis (HCC) Recently started on Ocrevus treatment for relapsed multiple sclerosis.  Literature reviewed on Ocrevus, may slightly increase the risk of breast cancer, but recommended  to screen for breast cancer as usual.  Genia Del MD, 2:51 PM 03/14/2017

## 2017-03-15 ENCOUNTER — Encounter: Payer: Self-pay | Admitting: Obstetrics & Gynecology

## 2017-03-15 NOTE — Patient Instructions (Signed)
1. Encounter for routine gynecological examination with Papanicolaou smear of cervix Normal gynecologic exam.  Pap reflex done.  Breast exam normal.  Last mammogram and breast ultrasound were: "Probably benign".  Recommended to repeat bilateral diagnostic mammogram and breast ultrasound in 1 year.  Recommend regular aerobic physical activity 5 times a week with weight lifting every 2 days. - Pap IG w/ reflex to HPV when ASC-U  2. Relies on partner vasectomy for contraception  3. Multiple sclerosis ([place]) Recently started on Ocrevus treatment for relapsed multiple sclerosis.  Literature reviewed on Ocrevus, may slightly increase the risk of breast cancer, but recommended to screen for breast cancer as usual.  Maria Graves, it was a pleasure seeing you today!  I will inform you of your results as soon as they are available.  Health Maintenance, Female Adopting a healthy lifestyle and getting preventive care can go a long way to promote health and wellness. Talk with your health care provider about what schedule of regular examinations is right for you. This is a good chance for you to check in with your provider about disease prevention and staying healthy. In between checkups, there are plenty of things you can do on your own. Experts have done a lot of research about which lifestyle changes and preventive measures are most likely to keep you healthy. Ask your health care provider for more information. Weight and diet Eat a healthy diet  Be sure to include plenty of vegetables, fruits, low-fat dairy products, and lean protein.  Do not eat a lot of foods high in solid fats, added sugars, or salt.  Get regular exercise. This is one of the most important things you can do for your health. ? Most adults should exercise for at least 150 minutes each week. The exercise should increase your heart rate and make you sweat (moderate-intensity exercise). ? Most adults should also do strengthening exercises at  least twice a week. This is in addition to the moderate-intensity exercise.  Maintain a healthy weight  Body mass index (BMI) is a measurement that can be used to identify possible weight problems. It estimates body fat based on height and weight. Your health care provider can help determine your BMI and help you achieve or maintain a healthy weight.  For females 55 years of age and older: ? A BMI below 18.5 is considered underweight. ? A BMI of 18.5 to 24.9 is normal. ? A BMI of 25 to 29.9 is considered overweight. ? A BMI of 30 and above is considered obese.  Watch levels of cholesterol and blood lipids  You should start having your blood tested for lipids and cholesterol at 49 years of age, then have this test every 5 years.  You may need to have your cholesterol levels checked more often if: ? Your lipid or cholesterol levels are high. ? You are older than 50 years of age. ? You are at high risk for heart disease.  Cancer screening Lung Cancer  Lung cancer screening is recommended for adults 50-59 years old who are at high risk for lung cancer because of a history of smoking.  A yearly low-dose CT scan of the lungs is recommended for people who: ? Currently smoke. ? Have quit within the past 15 years. ? Have at least a 30-pack-year history of smoking. A pack year is smoking an average of one pack of cigarettes a day for 1 year.  Yearly screening should continue until it has been 15 years since you quit.  Yearly screening should stop if you develop a health problem that would prevent you from having lung cancer treatment.  Breast Cancer  Practice breast self-awareness. This means understanding how your breasts normally appear and feel.  It also means doing regular breast self-exams. Let your health care provider know about any changes, no matter how small.  If you are in your 20s or 30s, you should have a clinical breast exam (CBE) by a health care provider every 1-3 years  as part of a regular health exam.  If you are 76 or older, have a CBE every year. Also consider having a breast X-ray (mammogram) every year.  If you have a family history of breast cancer, talk to your health care provider about genetic screening.  If you are at high risk for breast cancer, talk to your health care provider about having an MRI and a mammogram every year.  Breast cancer gene (BRCA) assessment is recommended for women who have family members with BRCA-related cancers. BRCA-related cancers include: ? Breast. ? Ovarian. ? Tubal. ? Peritoneal cancers.  Results of the assessment will determine the need for genetic counseling and BRCA1 and BRCA2 testing.  Cervical Cancer Your health care provider may recommend that you be screened regularly for cancer of the pelvic organs (ovaries, uterus, and vagina). This screening involves a pelvic examination, including checking for microscopic changes to the surface of your cervix (Pap test). You may be encouraged to have this screening done every 3 years, beginning at age 5.  For women ages 46-65, health care providers may recommend pelvic exams and Pap testing every 3 years, or they may recommend the Pap and pelvic exam, combined with testing for human papilloma virus (HPV), every 5 years. Some types of HPV increase your risk of cervical cancer. Testing for HPV may also be done on women of any age with unclear Pap test results.  Other health care providers may not recommend any screening for nonpregnant women who are considered low risk for pelvic cancer and who do not have symptoms. Ask your health care provider if a screening pelvic exam is right for you.  If you have had past treatment for cervical cancer or a condition that could lead to cancer, you need Pap tests and screening for cancer for at least 20 years after your treatment. If Pap tests have been discontinued, your risk factors (such as having a new sexual partner) need to be  reassessed to determine if screening should resume. Some women have medical problems that increase the chance of getting cervical cancer. In these cases, your health care provider may recommend more frequent screening and Pap tests.  Colorectal Cancer  This type of cancer can be detected and often prevented.  Routine colorectal cancer screening usually begins at 49 years of age and continues through 49 years of age.  Your health care provider may recommend screening at an earlier age if you have risk factors for colon cancer.  Your health care provider may also recommend using home test kits to check for hidden blood in the stool.  A small camera at the end of a tube can be used to examine your colon directly (sigmoidoscopy or colonoscopy). This is done to check for the earliest forms of colorectal cancer.  Routine screening usually begins at age 29.  Direct examination of the colon should be repeated every 5-10 years through 49 years of age. However, you may need to be screened more often if early forms of precancerous  polyps or small growths are found.  Skin Cancer  Check your skin from head to toe regularly.  Tell your health care provider about any new moles or changes in moles, especially if there is a change in a mole's shape or color.  Also tell your health care provider if you have a mole that is larger than the size of a pencil eraser.  Always use sunscreen. Apply sunscreen liberally and repeatedly throughout the day.  Protect yourself by wearing long sleeves, pants, a wide-brimmed hat, and sunglasses whenever you are outside.  Heart disease, diabetes, and high blood pressure  High blood pressure causes heart disease and increases the risk of stroke. High blood pressure is more likely to develop in: ? People who have blood pressure in the high end of the normal range (130-139/85-89 mm Hg). ? People who are overweight or obese. ? People who are African American.  If you  are 14-65 years of age, have your blood pressure checked every 3-5 years. If you are 67 years of age or older, have your blood pressure checked every year. You should have your blood pressure measured twice-once when you are at a hospital or clinic, and once when you are not at a hospital or clinic. Record the average of the two measurements. To check your blood pressure when you are not at a hospital or clinic, you can use: ? An automated blood pressure machine at a pharmacy. ? A home blood pressure monitor.  If you are between 81 years and 5 years old, ask your health care provider if you should take aspirin to prevent strokes.  Have regular diabetes screenings. This involves taking a blood sample to check your fasting blood sugar level. ? If you are at a normal weight and have a low risk for diabetes, have this test once every three years after 49 years of age. ? If you are overweight and have a high risk for diabetes, consider being tested at a younger age or more often. Preventing infection Hepatitis B  If you have a higher risk for hepatitis B, you should be screened for this virus. You are considered at high risk for hepatitis B if: ? You were born in a country where hepatitis B is common. Ask your health care provider which countries are considered high risk. ? Your parents were born in a high-risk country, and you have not been immunized against hepatitis B (hepatitis B vaccine). ? You have HIV or AIDS. ? You use needles to inject street drugs. ? You live with someone who has hepatitis B. ? You have had sex with someone who has hepatitis B. ? You get hemodialysis treatment. ? You take certain medicines for conditions, including cancer, organ transplantation, and autoimmune conditions.  Hepatitis C  Blood testing is recommended for: ? Everyone born from [birth year] through 1967. ? Anyone with known risk factors for hepatitis C.  Sexually transmitted infections (STIs)  You should be  screened for sexually transmitted infections (STIs) including gonorrhea and chlamydia if: ? You are sexually active and are younger than 49 years of age. ? You are older than 49 years of age and your health care provider tells you that you are at risk for this type of infection. ? Your sexual activity has changed since you were last screened and you are at an increased risk for chlamydia or gonorrhea. Ask your health care provider if you are at risk.  If you do not have HIV, but are at  risk, it may be recommended that you take a prescription medicine daily to prevent HIV infection. This is called pre-exposure prophylaxis (PrEP). You are considered at risk if: ? You are sexually active and do not regularly use condoms or know the HIV status of your partner(s). ? You take drugs by injection. ? You are sexually active with a partner who has HIV.  Talk with your health care provider about whether you are at high risk of being infected with HIV. If you choose to begin PrEP, you should first be tested for HIV. You should then be tested every 3 months for as long as you are taking PrEP. Pregnancy  If you are premenopausal and you may become pregnant, ask your health care provider about preconception counseling.  If you may become pregnant, take 400 to 800 micrograms (mcg) of folic acid every day.  If you want to prevent pregnancy, talk to your health care provider about birth control (contraception). Osteoporosis and menopause  Osteoporosis is a disease in which the bones lose minerals and strength with aging. This can result in serious bone fractures. Your risk for osteoporosis can be identified using a bone density scan.  If you are 49 years of age or older, or if you are at risk for osteoporosis and fractures, ask your health care provider if you should be screened.  Ask your health care provider whether you should take a calcium or vitamin D supplement to lower your risk for  osteoporosis.  Menopause may have certain physical symptoms and risks.  Hormone replacement therapy may reduce some of these symptoms and risks. Talk to your health care provider about whether hormone replacement therapy is right for you. Follow these instructions at home:  Schedule regular health, dental, and eye exams.  Stay current with your immunizations.  Do not use any tobacco products including cigarettes, chewing tobacco, or electronic cigarettes.  If you are pregnant, do not drink alcohol.  If you are breastfeeding, limit how much and how often you drink alcohol.  Limit alcohol intake to no more than 1 drink per day for nonpregnant women. One drink equals 12 ounces of beer, 5 ounces of wine, or 1 ounces of hard liquor.  Do not use street drugs.  Do not share needles.  Ask your health care provider for help if you need support or information about quitting drugs.  Tell your health care provider if you often feel depressed.  Tell your health care provider if you have ever been abused or do not feel safe at home. This information is not intended to replace advice given to you by your health care provider. Make sure you discuss any questions you have with your health care provider. Document Released: 07/31/2010 Document Revised: 06/23/2015 Document Reviewed: 10/19/2014 Elsevier Interactive Patient Education  [employer].

## 2017-03-20 LAB — PAP IG W/ RFLX HPV ASCU

## 2017-03-20 LAB — HUMAN PAPILLOMAVIRUS, HIGH RISK: HPV DNA High Risk: NOT DETECTED

## 2017-04-08 ENCOUNTER — Other Ambulatory Visit: Payer: 59

## 2017-06-10 ENCOUNTER — Encounter: Payer: Self-pay | Admitting: Internal Medicine

## 2017-06-10 ENCOUNTER — Ambulatory Visit (INDEPENDENT_AMBULATORY_CARE_PROVIDER_SITE_OTHER): Payer: Managed Care, Other (non HMO) | Admitting: Internal Medicine

## 2017-06-10 VITALS — BP 124/68 | HR 68 | Temp 97.9°F | Resp 14 | Ht 66.0 in | Wt 206.1 lb

## 2017-06-10 DIAGNOSIS — R739 Hyperglycemia, unspecified: Secondary | ICD-10-CM | POA: Diagnosis not present

## 2017-06-10 DIAGNOSIS — Z0000 Encounter for general adult medical examination without abnormal findings: Secondary | ICD-10-CM

## 2017-06-10 LAB — CBC WITH DIFFERENTIAL/PLATELET
BASOS ABS: 0.1 10*3/uL (ref 0.0–0.1)
Basophils Relative: 1.5 % (ref 0.0–3.0)
EOS PCT: 1.8 % (ref 0.0–5.0)
Eosinophils Absolute: 0.1 10*3/uL (ref 0.0–0.7)
HEMATOCRIT: 43.9 % (ref 36.0–46.0)
HEMOGLOBIN: 14.7 g/dL (ref 12.0–15.0)
LYMPHS ABS: 1.6 10*3/uL (ref 0.7–4.0)
Lymphocytes Relative: 19.8 % (ref 12.0–46.0)
MCHC: 33.4 g/dL (ref 30.0–36.0)
MCV: 86.9 fl (ref 78.0–100.0)
Monocytes Absolute: 0.6 10*3/uL (ref 0.1–1.0)
Monocytes Relative: 7.2 % (ref 3.0–12.0)
NEUTROS ABS: 5.6 10*3/uL (ref 1.4–7.7)
Neutrophils Relative %: 69.7 % (ref 43.0–77.0)
Platelets: 262 10*3/uL (ref 150.0–400.0)
RBC: 5.05 Mil/uL (ref 3.87–5.11)
RDW: 13.6 % (ref 11.5–15.5)
WBC: 8 10*3/uL (ref 4.0–10.5)

## 2017-06-10 LAB — LIPID PANEL
CHOL/HDL RATIO: 3
Cholesterol: 176 mg/dL (ref 0–200)
HDL: 52.1 mg/dL (ref >39.00)
LDL Cholesterol: 104 mg/dL — ABNORMAL HIGH (ref 0–99)
NONHDL: 124.34
Triglycerides: 104 mg/dL (ref 0.0–149.0)
VLDL: 20.8 mg/dL (ref 0.0–40.0)

## 2017-06-10 LAB — HEMOGLOBIN A1C: HEMOGLOBIN A1C: 5.5 % (ref 4.6–6.5)

## 2017-06-10 LAB — BASIC METABOLIC PANEL
BUN: 12 mg/dL (ref 6–23)
CHLORIDE: 102 meq/L (ref 96–112)
CO2: 28 meq/L (ref 19–32)
Calcium: 9.2 mg/dL (ref 8.4–10.5)
Creatinine, Ser: 0.7 mg/dL (ref 0.40–1.20)
GFR: 94.45 mL/min (ref >60.00)
GLUCOSE: 98 mg/dL (ref 70–99)
Potassium: 4.2 mEq/L (ref 3.5–5.1)
SODIUM: 137 meq/L (ref 135–145)

## 2017-06-10 NOTE — Assessment & Plan Note (Signed)
Hyperglycemia: Checking A1c Multiple sclerosis, follow-up by neurology, MRI [id number] with 3 new MS lesions.  Currently on Ocrevus, to have a MRI later this year.  Overall doing well emotionally.  On B12 and vitamin D supplements as recommended by neurology (her results were in the lower side of normal). HTN: On lisinopril, controlled.  Checking labs Leukocytosis: Mild, noted on labs, rechecking today RTC 1 year

## 2017-06-10 NOTE — Patient Instructions (Signed)
GO TO THE LAB : Get the blood work     GO TO THE FRONT DESK Schedule your next appointment   for a physical exam in 1 year 

## 2017-06-10 NOTE — Progress Notes (Signed)
Subjective:    Patient ID: Maria Graves, female    DOB: 1970-04-14, 47 y.o.   MRN: [id number]  DOS:  06/10/2017 Type of visit - description : cpx Interval history: In general feeling well, latest brain MRI showed +  MS activity.  Neurology note reviewed   Review of Systems Allergies under control at this time Going into her menopause, periods  irregular Occasional aches and pains at the knuckles and fingers.  Areas look slightly puffy at times but not red or hot.  Other than above, a 14 point review of systems is negative      Past Medical History:  Diagnosis Date  . Contraception    husband vasectomy  . Essential tremor   . Gestational diabetes   . HTN (hypertension)   . MS (multiple sclerosis) ([facility])   . Proteinuria    Reports mild, chronic proteinuria    Past Surgical History:  Procedure Laterality Date  . TONSILLECTOMY  1991    Social History   Socioeconomic History  . Marital status: Married    Spouse name: Not on file  . Number of children: 2  . Years of education: Not on file  . Highest education level: Not on file  Occupational History  . 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 Financial resource strain: Not on file  . Food insecurity:    Worry: Not on file    Inability: Not on file  . Transportation needs:    Medical: Not on file    Non-medical: Not on file  Tobacco Use  . Smoking status: Former Smoker    Last attempt to quit: 02/11/2017    Years since quitting: 0.3  . Smokeless tobacco: Never Used  . Tobacco comment: vaping as off 05-2017  Substance and Sexual Activity  . Alcohol use: Yes    Alcohol/week: 0.0 oz    Comment: Socially  . Drug use: No  . Sexual activity: Yes    Partners: Male    Comment: 1st intercourse- 39, partners- , married- 22 yrs   Lifestyle  . Physical activity:    Days per week: Not on file    Minutes per session: Not on file  . Stress: Not on file  Relationships  . Social  connections:    Talks on phone: Not on file    Gets together: Not on file    Attends religious service: Not on file    Active member of club or organization: Not on file    Attends meetings of clubs or organizations: Not on file    Relationship status: Not on file  . Intimate partner violence:    Fear of current or ex partner: Not on file    Emotionally abused: Not on file    Physically abused: Not on file    Forced sexual activity: Not on file  Other Topics Concern  . Not on file  Social History Narrative   Moved from [place] July 2014   G3P2   Son going to [employer], Forensic scientist      Family History  Problem Relation Age of Onset  . Arthritis Mother   . Alcohol abuse Mother   . Hypertension Brother   . Heart disease Paternal Grandfather   . Heart attack Paternal Grandfather   . Stroke Paternal Grandmother   . Diabetes Father   . Clotting disorder Father   . Diabetes Maternal Grandfather   . Sudden  death Other        uncle, GF  . Colon cancer Neg Hx   . Breast cancer Neg Hx      Allergies as of 06/10/2017      Reactions   Erythromycin Base Nausea Only   Sulfa Antibiotics Hives   Bactrim caused Hives   Tetanus Toxoids Swelling   As a child      Medication List        Accurate as of 06/10/17  8:35 PM. Always use your most recent med list.          BEE POLLEN PO Take by mouth.   famotidine 10 MG tablet Commonly known as:  PEPCID Take 10 mg by mouth daily.   fexofenadine 180 MG tablet Commonly known as:  ALLEGRA Take 180 mg by mouth daily.   lisinopril 20 MG tablet Commonly known as:  PRINIVIL,ZESTRIL Take 1 tablet (20 mg total) by mouth daily.   OCREVUS IV Inject into the vein.   PROBIOTIC DAILY PO Take 1 tablet by mouth daily.   vitamin B-12 500 MCG tablet Commonly known as:  CYANOCOBALAMIN Take 500 mcg by mouth daily.   Vitamin D3 2000 units capsule Take 4,000 Units by mouth daily.          Objective:   Physical Exam BP 124/68  (BP Location: Left Arm, Patient Position: Sitting, Cuff Size: Small)   Pulse 68   Temp 97.9 F (36.6 C) (Oral)   Resp 14   Ht 5\' 6"  (1.676 m)   Wt 206 lb 2 oz (93.5 kg)   SpO2 97%   BMI 33.27 kg/m  General:   Well developed, well nourished . NAD.  Neck: No  thyromegaly  HEENT:  Normocephalic . Face symmetric, atraumatic Lungs:  CTA B Normal respiratory effort, no intercostal retractions, no accessory muscle use. Heart: RRR,  no murmur.  No pretibial edema bilaterally  Abdomen:  Not distended, soft, non-tender. No rebound or rigidity.   Skin: Exposed areas without rash. Not pale. Not jaundice Neurologic:  alert & oriented X3.  Speech normal, gait appropriate for age and unassisted Strength symmetric and appropriate for age.  Mild tremors noted MSK: Hands and wrists without synovitis Psych: Cognition and judgment appear intact.  Cooperative with normal attention span and concentration.  Behavior appropriate. No anxious or depressed appearing.     Assessment & Plan:   Assessment A1C 5.8 Multiple sclerosis: neuro @ [facility].  DX 2003 Essential tremor HTN Pulmonary nodule, last CT 05-2015, stable, no further scans Gestational diabetes Proteinuria: Mild, chronic Contraception: Husband vasectomy  PLAN: Hyperglycemia: Checking A1c Multiple sclerosis, follow-up by neurology, MRI [phone] with 3 new MS lesions.  Currently on Ocrevus, to have a MRI later this year.  Overall doing well emotionally.  On B12 and vitamin D supplements as recommended by neurology (her results were in the lower side of normal). HTN: On lisinopril, controlled.  Checking labs Leukocytosis: Mild, noted on labs, rechecking today RTC 1 year

## 2017-06-10 NOTE — Assessment & Plan Note (Addendum)
--  Td -- intolerant, ++ swelling  --Female care per Gyn Dr Laurie Panda;  Updated on PAPs-MMGs  --Never had a cscope, we discussed different screening modalities, will reassess next year when she is 48 --Labs: BMP, FLP, CBC, A1c --Tobacco, now vaping.  Praised.  Plans to quit vaping at some point. --diet, exercise discussed

## 2017-06-10 NOTE — Progress Notes (Signed)
Pre visit review using our clinic review tool, if applicable. No additional management support is needed unless otherwise documented below in the visit note. 

## 2017-08-04 ENCOUNTER — Other Ambulatory Visit: Payer: Self-pay | Admitting: Internal Medicine

## 2018-03-17 ENCOUNTER — Telehealth: Payer: Managed Care, Other (non HMO) | Admitting: Physician Assistant

## 2018-03-17 ENCOUNTER — Encounter: Payer: Self-pay | Admitting: Physician Assistant

## 2018-03-17 DIAGNOSIS — B9689 Other specified bacterial agents as the cause of diseases classified elsewhere: Secondary | ICD-10-CM

## 2018-03-17 DIAGNOSIS — J208 Acute bronchitis due to other specified organisms: Secondary | ICD-10-CM | POA: Diagnosis not present

## 2018-03-17 MED ORDER — BENZONATATE 200 MG PO CAPS: 200.0000 mg | ORAL_CAPSULE | Freq: Three times a day (TID) | ORAL | 0 refills | Status: DC | PRN

## 2018-03-17 MED ORDER — AZITHROMYCIN 250 MG PO TABS: ORAL_TABLET | ORAL | 0 refills | Status: DC

## 2018-03-17 NOTE — Progress Notes (Signed)
We are sorry that you are not feeling well.  Here is how we plan to help!  Based on your presentation I believe you most likely have A cough due to bacteria.  When patients have a fever and a productive cough with a change in color or increased sputum production, we are concerned about bacterial bronchitis.  If left untreated it can progress to pneumonia.  If your symptoms do not improve with your treatment plan it is important that you contact your provider.   I have prescribed Azithromyin 250 mg: two tablets now and then one tablet daily for 4 additonal days    In addition you may use A prescription cough medication called Tessalon Perles 100mg . You may take 1-2 capsules every 8 hours as needed for your cough.  Thanks for your response! Your symptoms are less likely flu in nature. Flu is typically acute in onset and with high fever.  With flu, your symptoms would typically be better by now, since its been a week, and thus the Tamiflu is not indicated since its to be taken in the first 24-48 hours. I will treat you for bacterial bronchitis-this is supported by your cough, change in color of sputum, chronicity of your symptoms. Also hx of HTN and MS may increase risk of Pneumonia, thus will prescribe antibiotics.   From your responses in the eVisit questionnaire you describe inflammation in the upper respiratory tract which is causing a significant cough.  This is commonly called Bronchitis and has four common causes:    Allergies  Viral Infections  Acid Reflux  Bacterial Infection Allergies, viruses and acid reflux are treated by controlling symptoms or eliminating the cause. An example might be a cough caused by taking certain blood pressure medications. You stop the cough by changing the medication. Another example might be a cough caused by acid reflux. Controlling the reflux helps control the cough.  USE OF BRONCHODILATOR ("RESCUE") INHALERS: There is a risk from using your bronchodilator  too frequently.  The risk is that over-reliance on a medication which only relaxes the muscles surrounding the breathing tubes can reduce the effectiveness of medications prescribed to reduce swelling and congestion of the tubes themselves.  Although you feel brief relief from the bronchodilator inhaler, your asthma may actually be worsening with the tubes becoming more swollen and filled with mucus.  This can delay other crucial treatments, such as oral steroid medications. If you need to use a bronchodilator inhaler daily, several times per day, you should discuss this with your provider.  There are probably better treatments that could be used to keep your asthma under control.     HOME CARE . Only take medications as instructed by your medical team. . Complete the entire course of an antibiotic. . Drink plenty of fluids and get plenty of rest. . Avoid close contacts especially the very young and the elderly . Cover your mouth if you cough or cough into your sleeve. . Always remember to wash your hands . A steam or ultrasonic humidifier can help congestion.   GET HELP RIGHT AWAY IF: . You develop worsening fever. . You become short of breath . You cough up blood. . Your symptoms persist after you have completed your treatment plan MAKE SURE YOU   Understand these instructions.  Will watch your condition.  Will get help right away if you are not doing well or get worse.  Your e-visit answers were reviewed by a board certified advanced clinical practitioner to  complete your personal care plan.  Depending on the condition, your plan could have included both over the counter or prescription medications. If there is a problem please reply  once you have received a response from your provider. Your safety is important to [place].  If you have drug allergies check your prescription carefully.    You can use MyChart to ask questions about today's visit, request a non-urgent call back, or ask for a  work or school excuse for 24 hours related to this e-Visit. If it has been greater than 24 hours you will need to follow up with your provider, or enter a new e-Visit to address those concerns. You will get an e-mail in the next two days asking about your experience.  I hope that your e-visit has been valuable and will speed your recovery. Thank you for using e-visits.

## 2018-06-07 ENCOUNTER — Other Ambulatory Visit: Payer: Self-pay | Admitting: Internal Medicine

## 2018-06-09 NOTE — Telephone Encounter (Signed)
Rx sent for lisinopril 10mg : 2 tabs daily. MyChart message sent to Pt.

## 2018-06-09 NOTE — Telephone Encounter (Signed)
Lisinopril 20mg  on back order- [facility] requesting to change to Lisinopril 10mg : 2 tabs daily. Please advise.

## 2018-06-09 NOTE — Telephone Encounter (Signed)
Okay to send for 9 months

## 2018-06-12 ENCOUNTER — Other Ambulatory Visit: Payer: Self-pay

## 2018-06-12 ENCOUNTER — Other Ambulatory Visit: Payer: Self-pay | Admitting: Emergency Medicine

## 2018-06-12 ENCOUNTER — Ambulatory Visit (INDEPENDENT_AMBULATORY_CARE_PROVIDER_SITE_OTHER): Payer: Managed Care, Other (non HMO) | Admitting: Internal Medicine

## 2018-06-12 ENCOUNTER — Encounter: Payer: Self-pay | Admitting: Internal Medicine

## 2018-06-12 DIAGNOSIS — G35 Multiple sclerosis: Secondary | ICD-10-CM

## 2018-06-12 DIAGNOSIS — R739 Hyperglycemia, unspecified: Secondary | ICD-10-CM

## 2018-06-12 DIAGNOSIS — Z0000 Encounter for general adult medical examination without abnormal findings: Secondary | ICD-10-CM | POA: Diagnosis not present

## 2018-06-12 LAB — COMPREHENSIVE METABOLIC PANEL
ALT: 17 U/L (ref 0–35)
AST: 12 U/L (ref 0–37)
Albumin: 4.3 g/dL (ref 3.5–5.2)
Alkaline Phosphatase: 55 U/L (ref 39–117)
BUN: 15 mg/dL (ref 6–23)
CO2: 29 mEq/L (ref 19–32)
Calcium: 9.1 mg/dL (ref 8.4–10.5)
Chloride: 101 mEq/L (ref 96–112)
Creatinine, Ser: 0.69 mg/dL (ref 0.40–1.20)
GFR: 89.98 mL/min (ref >60.00)
Glucose, Bld: 82 mg/dL (ref 70–99)
Potassium: 4.8 mEq/L (ref 3.5–5.1)
Sodium: 137 mEq/L (ref 135–145)
Total Bilirubin: 0.4 mg/dL (ref 0.2–1.2)
Total Protein: 6.7 g/dL (ref 6.0–8.3)

## 2018-06-12 LAB — LIPID PANEL
Cholesterol: 172 mg/dL (ref 0–200)
HDL: 49.5 mg/dL (ref >39.00)
LDL Cholesterol: 84 mg/dL (ref 0–99)
NonHDL: 122.48
Total CHOL/HDL Ratio: 3
Triglycerides: 194 mg/dL — ABNORMAL HIGH (ref 0.0–149.0)
VLDL: 38.8 mg/dL (ref 0.0–40.0)

## 2018-06-12 LAB — CBC WITH DIFFERENTIAL/PLATELET
Basophils Absolute: 0.1 10*3/uL (ref 0.0–0.1)
Basophils Relative: 0.9 % (ref 0.0–3.0)
Eosinophils Absolute: 0.2 10*3/uL (ref 0.0–0.7)
Eosinophils Relative: 2.7 % (ref 0.0–5.0)
HCT: 42.6 % (ref 36.0–46.0)
Hemoglobin: 14.3 g/dL (ref 12.0–15.0)
Lymphocytes Relative: 23 % (ref 12.0–46.0)
Lymphs Abs: 2.1 10*3/uL (ref 0.7–4.0)
MCHC: 33.5 g/dL (ref 30.0–36.0)
MCV: 85.2 fl (ref 78.0–100.0)
Monocytes Absolute: 0.6 10*3/uL (ref 0.1–1.0)
Monocytes Relative: 7.1 % (ref 3.0–12.0)
Neutro Abs: 6 10*3/uL (ref 1.4–7.7)
Neutrophils Relative %: 66.3 % (ref 43.0–77.0)
Platelets: 254 10*3/uL (ref 150.0–400.0)
RBC: 5 Mil/uL (ref 3.87–5.11)
RDW: 13.3 % (ref 11.5–15.5)
WBC: 9 10*3/uL (ref 4.0–10.5)

## 2018-06-12 LAB — VITAMIN B12: Vitamin B-12: 355 pg/mL (ref 211–911)

## 2018-06-12 LAB — VITAMIN D 25 HYDROXY (VIT D DEFICIENCY, FRACTURES): VITD: 35.98 ng/mL (ref 30.00–100.00)

## 2018-06-12 LAB — HEMOGLOBIN A1C: Hgb A1c MFr Bld: 5.7 % (ref 4.6–6.5)

## 2018-06-12 NOTE — Assessment & Plan Note (Signed)
--  Td: intolerant, ++ swelling  --Female care per Gyn Dr Laurie Panda, last mammogram 03/2017 --Never had a cscope, 3 options discussed, elected IFOB, somewhat reluctant to proceed with a colonoscopy in the midst of a pandemia. --Labs: CMP, FLP, CBC.  A1c due to mild hyperglycemia.  B12 and vitamin D due to MS --Tobacco: quit vaping, praised --lifestyle: She remains very active walking her dogs, diet has not changed, has nevertheless gained some weight.  Encouraged to continue exercising and watch her diet more closely

## 2018-06-12 NOTE — Progress Notes (Signed)
Subjective:    Patient ID: Maria Graves, female    DOB: 07/08/1970, 48 y.o.   MRN: [id number]  DOS:  06/12/2018 Type of visit - description: Virtual Visit via Video Note  I connected with@ on 06/13/18 at  9:00 AM EDT by a video enabled telemedicine application and verified that I am speaking with the correct person using two identifiers.   THIS ENCOUNTER IS A VIRTUAL VISIT DUE TO COVID-19 - PATIENT WAS NOT SEEN IN THE OFFICE. PATIENT HAS CONSENTED TO VIRTUAL VISIT / TELEMEDICINE VISIT   Location of patient: home  Location of provider: office  I discussed the limitations of evaluation and management by telemedicine and the availability of in person appointments. The patient expressed understanding and agreed to proceed.  History of Present Illness: Physical exam In general she feels well. She remains active, diet remains healthy yet she is gaining some weight.     Review of Systems   A 14 point review of systems is negative    Past Medical History:  Diagnosis Date  . Contraception    husband vasectomy  . Essential tremor   . Gestational diabetes   . HTN (hypertension)   . MS (multiple sclerosis) (HCC)   . Proteinuria    Reports mild, chronic proteinuria    Past Surgical History:  Procedure Laterality Date  . TONSILLECTOMY  1991    Social History   Socioeconomic History  . Marital status: Married    Spouse name: Not on file  . Number of children: 2  . Years of education: Not on file  . Highest education level: Not on file  Occupational History  . 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 Financial resource strain: Not on file  . Food insecurity:    Worry: Not on file    Inability: Not on file  . Transportation needs:    Medical: Not on file    Non-medical: Not on file  Tobacco Use  . Smoking status: Former Smoker    Last attempt to quit: 02/11/2017    Years since quitting: 1.3  . Smokeless tobacco: Never Used  . Tobacco  comment: vaping as off 05-2017  Substance and Sexual Activity  . Alcohol use: Yes    Alcohol/week: 0.0 standard drinks    Comment: Socially  . Drug use: No  . Sexual activity: Yes    Partners: Male    Comment: 1st intercourse- 29, partners- , married- 22 yrs   Lifestyle  . Physical activity:    Days per week: Not on file    Minutes per session: Not on file  . Stress: Not on file  Relationships  . Social connections:    Talks on phone: Not on file    Gets together: Not on file    Attends religious service: Not on file    Active member of club or organization: Not on file    Attends meetings of clubs or organizations: Not on file    Relationship status: Not on file  . Intimate partner violence:    Fear of current or ex partner: Not on file    Emotionally abused: Not on file    Physically abused: Not on file    Forced sexual activity: Not on file  Other Topics Concern  . Not on file  Social History Narrative   Moved from [place] July 2014   G3P2   Son going to [employer], Forensic scientist  2 children at home (college on line , HS online)     Family History  Problem Relation Age of Onset  . Arthritis Mother   . Alcohol abuse Mother   . Hypertension Brother   . Heart disease Paternal Grandfather   . Heart attack Paternal Grandfather   . Stroke Paternal Grandmother   . Diabetes Father   . Clotting disorder Father   . Diabetes Maternal Grandfather   . Sudden death Other        uncle, GF  . Colon cancer Neg Hx   . Breast cancer Neg Hx      Allergies as of 06/12/2018      Reactions   Erythromycin Base Nausea Only   Sulfa Antibiotics Hives   Bactrim caused Hives   Tetanus Toxoids Swelling   As a child      Medication List       Accurate as of Jun 12, 2018 11:59 PM. If you have any questions, ask your nurse or doctor.        STOP taking these medications   azithromycin 250 MG tablet Commonly known as:  ZITHROMAX Stopped by:  Willow Ora, MD   benzonatate 200  MG capsule Commonly known as:  TESSALON Stopped by:  Willow Ora, MD   PROBIOTIC DAILY PO Stopped by:  Willow Ora, MD     TAKE these medications   BEE POLLEN PO Take by mouth.   famotidine 10 MG tablet Commonly known as:  PEPCID Take 10 mg by mouth daily.   fexofenadine 180 MG tablet Commonly known as:  ALLEGRA Take 180 mg by mouth daily.   lisinopril 10 MG tablet Commonly known as:  ZESTRIL Take 2 tablets (20 mg total) by mouth daily.   OCREVUS IV Inject into the vein.   vitamin B-12 500 MCG tablet Commonly known as:  CYANOCOBALAMIN Take 500 mcg by mouth daily.   Vitamin D3 50 MCG (2000 UT) capsule Take 4,000 Units by mouth daily.           Objective:   Physical Exam There were no vitals taken for this visit. This is a virtual video visit.  The patient is alert oriented x3, no apparent distress, in good spirits    Assessment    Assessment A1C 5.8 Multiple sclerosis: neuro @ [facility].  DX 2003 Essential tremor HTN Pulmonary nodule, last CT 05-2015, stable, no further scans Gestational diabetes Proteinuria: Mild, chronic Contraception: Husband vasectomy  PLAN: Hyper glycemia: Check A1c MS: Request to check B12 vitamin D levels to share with her neurologist HTN: Good compliance with medications, no change. RTC 1 year   I discussed the assessment and treatment plan with the patient. The patient was provided an opportunity to ask questions and all were answered. The patient agreed with the plan and demonstrated an understanding of the instructions.   The patient was advised to call back or seek an in-person evaluation if the symptoms worsen or if the condition fails to improve as anticipated.

## 2018-06-13 NOTE — Assessment & Plan Note (Signed)
Hyper glycemia: Check A1c MS: Request to check B12 vitamin D levels to share with her neurologist HTN: Good compliance with medications, no change. RTC 1 year

## 2018-07-30 ENCOUNTER — Other Ambulatory Visit: Payer: Self-pay | Admitting: *Deleted

## 2018-07-30 DIAGNOSIS — Z20822 Contact with and (suspected) exposure to covid-19: Secondary | ICD-10-CM

## 2018-07-31 LAB — NOVEL CORONAVIRUS, NAA: SARS-CoV-2, NAA: NOT DETECTED

## 2018-10-17 ENCOUNTER — Ambulatory Visit (INDEPENDENT_AMBULATORY_CARE_PROVIDER_SITE_OTHER)
Admission: RE | Admit: 2018-10-17 | Discharge: 2018-10-17 | Disposition: A | Discharge status: Home / Self Care | Payer: Managed Care, Other (non HMO) | Source: Ambulatory Visit

## 2018-10-17 ENCOUNTER — Other Ambulatory Visit: Payer: Self-pay

## 2018-10-17 DIAGNOSIS — R350 Frequency of micturition: Secondary | ICD-10-CM | POA: Diagnosis not present

## 2018-10-17 DIAGNOSIS — R300 Dysuria: Secondary | ICD-10-CM

## 2018-10-17 MED ORDER — NITROFURANTOIN MONOHYD MACRO 100 MG PO CAPS: 100.0000 mg | ORAL_CAPSULE | Freq: Two times a day (BID) | ORAL | 0 refills | Status: DC

## 2018-10-17 NOTE — Discharge Instructions (Signed)
We will start treatment for UTI with antibiotics.  Complete course of antibiotics.  If no improvement or any persistent or worsening symptoms please be seen in person for a urine specimen to be collected and tested.  Drink plenty of water to empty bladder regularly. Avoid alcohol and caffeine as these may irritate the bladder.

## 2018-10-17 NOTE — ED Provider Notes (Signed)
Virtual Visit via Video Note:  [name]  initiated request for Telemedicine visit with [facility] Urgent Care team. I connected with [name]  on 10/17/2018 at 10:32 AM  for a synchronized telemedicine visit using a video enabled HIPPA compliant telemedicine application. I verified that I am speaking with [name]  using two identifiers. Zigmund Gottron, NP  was physically located in a [facility] Urgent care site and VENORA KAUTZMAN was located at a different location.   The limitations of evaluation and management by telemedicine as well as the availability of in-person appointments were discussed. Patient was informed that she  may incur a bill ( including co-pay) for this virtual visit encounter. Lydia Guiles Furniss  expressed understanding and gave verbal consent to proceed with virtual visit.     History of Present Illness:Maria Graves  is a 48 y.o. female presents with complaints of pelvic cramping a few days ago, yesterday felt very fatigued, last night frequency with urination and now with burning with urination. LMP unknown, they have been irregular- 4 since January, she feels she is going through [employer]. Husband had vasectomy, no other birth control. No blood in urine. Some low back pain. No fever but has felt warm. No other gi symptoms. History of UTI, hasn't had any in years. Takes a daily probiotic which she feels helps. Well hydrated. evisit in 2017 for UTI with macrobid, was effective. Kidney infection in 2014, received cipro. Symptoms are not nearly as severe as with infection. History  Of ms.  Takes lisinopril.  Past Medical History:  Diagnosis Date  . Contraception    husband vasectomy  . Essential tremor   . Gestational diabetes   . HTN (hypertension)   . MS (multiple sclerosis) ([place])   . Proteinuria    Reports mild, chronic proteinuria    Allergies  Allergen Reactions  . Erythromycin Base Nausea Only  . Sulfa Antibiotics Hives    Bactrim  caused Hives  . Tetanus Toxoids Swelling    As a child        Observations/Objective: Alert, oriented, non toxic in appearance. Clear coherent speech without difficulty. No increased work of breathing visualized.    Assessment and Plan: Concern for UTI. Encouraged to take home pregnancy test since irregular periods, although it is reassuring that husband had vasectomy. macrobid initiated. Strict in person assessment recommended for any persistent or worsening of symptoms. Patient verbalized understanding and agreeable to plan.    Follow Up Instructions:    I discussed the assessment and treatment plan with the patient. The patient was provided an opportunity to ask questions and all were answered. The patient agreed with the plan and demonstrated an understanding of the instructions.   The patient was advised to call back or seek an in-person evaluation if the symptoms worsen or if the condition fails to improve as anticipated.  I provided 15 minutes of non-face-to-face time during this encounter.    Zigmund Gottron, NP  10/17/2018 10:32 AM         Zigmund Gottron, NP 10/17/18 [phone]

## 2018-10-21 ENCOUNTER — Ambulatory Visit (INDEPENDENT_AMBULATORY_CARE_PROVIDER_SITE_OTHER): Payer: Managed Care, Other (non HMO) | Admitting: Internal Medicine

## 2018-10-21 ENCOUNTER — Other Ambulatory Visit: Payer: Self-pay

## 2018-10-21 VITALS — Ht 66.0 in | Wt 215.0 lb

## 2018-10-21 DIAGNOSIS — N390 Urinary tract infection, site not specified: Secondary | ICD-10-CM | POA: Diagnosis not present

## 2018-10-21 DIAGNOSIS — R350 Frequency of micturition: Secondary | ICD-10-CM | POA: Diagnosis not present

## 2018-10-21 LAB — POCT URINALYSIS DIPSTICK
Bilirubin, UA: NEGATIVE
Glucose, UA: NEGATIVE
Ketones, UA: NEGATIVE
Leukocytes, UA: NEGATIVE
Nitrite, UA: NEGATIVE
Protein, UA: NEGATIVE
Spec Grav, UA: 1.02 (ref 1.010–1.025)
Urobilinogen, UA: 0.2 E.U./dL
pH, UA: 6 (ref 5.0–8.0)

## 2018-10-21 MED ORDER — CIPROFLOXACIN HCL 500 MG PO TABS: 500.0000 mg | ORAL_TABLET | Freq: Two times a day (BID) | ORAL | 0 refills | Status: DC

## 2018-10-21 NOTE — Progress Notes (Signed)
Subjective:    Patient ID: Maria Graves, female    DOB: Apr 11, 1970, 48 y.o.   MRN: [id number]  DOS:  10/21/2018 Type of visit - description: Virtual Visit via Video Note  I connected with@   by a video enabled telemedicine application and verified that I am speaking with the correct person using two identifiers.   THIS ENCOUNTER IS A VIRTUAL VISIT DUE TO COVID-19 - PATIENT WAS NOT SEEN IN THE OFFICE. PATIENT HAS CONSENTED TO VIRTUAL VISIT / TELEMEDICINE VISIT   Location of patient: home  Location of provider: office  I discussed the limitations of evaluation and management by telemedicine and the availability of in person appointments. The patient expressed understanding and agreed to proceed.  History of Present Illness: Acute 6 days ago she developed lower abdominal discomfort described as " Cramps" along with urinary frequency, dysuria. 4 days ago went to urgent care virtually, was prescribed Macrobid. After the first dose she developed a fever with a temperature of 100.  Also some headaches and hand/feet numbness. Symptoms lasted 24 hours and they are gone.  Since then, urinary frequency has decreased but this is still there, sometimes she has a feeling she is not emptying her bladder completely.   Review of Systems Denies nausea or vomiting No vaginal discharge to speak of. No vaginal rash or bleeding. No gross hematuria LMP: periods are irregular. Mild flank pain?  Past Medical History:  Diagnosis Date  . Contraception    husband vasectomy  . Essential tremor   . Gestational diabetes   . HTN (hypertension)   . MS (multiple sclerosis) ([place])   . Proteinuria    Reports mild, chronic proteinuria    Past Surgical History:  Procedure Laterality Date  . TONSILLECTOMY  1991    Social History   Socioeconomic History  . Marital status: Married    Spouse name: Not on file  . Number of children: 2  . Years of education: Not on file  . Highest education level:  Not on file  Occupational History  . 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 Financial resource strain: Not on file  . Food insecurity    Worry: Not on file    Inability: Not on file  . Transportation needs    Medical: Not on file    Non-medical: Not on file  Tobacco Use  . Smoking status: Former Smoker    Quit date: 02/11/2017    Years since quitting: 1.6  . Smokeless tobacco: Never Used  . Tobacco comment: vaping as off 05-2017  Substance and Sexual Activity  . Alcohol use: Yes    Alcohol/week: 0.0 standard drinks    Comment: Socially  . Drug use: No  . Sexual activity: Yes    Partners: Male    Comment: 1st intercourse- 68, partners- , married- 56 yrs   Lifestyle  . Physical activity    Days per week: Not on file    Minutes per session: Not on file  . Stress: Not on file  Relationships  . Social Herbalist on phone: Not on file    Gets together: Not on file    Attends religious service: Not on file    Active member of club or organization: Not on file    Attends meetings of clubs or organizations: Not on file    Relationship status: Not on file  . Intimate partner violence  Fear of current or ex partner: Not on file    Emotionally abused: Not on file    Physically abused: Not on file    Forced sexual activity: Not on file  Other Topics Concern  . Not on file  Social History Narrative   Moved from [place] July 2014   G3P2   Son going to [employer], Forensic scientist    2 children at home (college on line , [employer] online)      Allergies as of 10/21/2018      Reactions   Erythromycin Base Nausea Only   Sulfa Antibiotics Hives   Bactrim caused Hives   Tetanus Toxoids Swelling   As a child   Macrobid [nitrofurantoin Macrocrystal] Other (See Comments)   Caused fever? See  OV 10/21/2018      Medication List       Accurate as of October 21, 2018 11:59 PM. If you have any questions, ask your nurse or doctor.        STOP taking  these medications   nitrofurantoin (macrocrystal-monohydrate) 100 MG capsule Commonly known as: MACROBID Stopped by: Willow Ora, MD     TAKE these medications   BEE POLLEN PO Take by mouth.   ciprofloxacin 500 MG tablet Commonly known as: CIPRO Take 1 tablet (500 mg total) by mouth 2 (two) times daily. Started by: Willow Ora, MD   famotidine 10 MG tablet Commonly known as: PEPCID Take 10 mg by mouth daily.   fexofenadine 180 MG tablet Commonly known as: ALLEGRA Take 180 mg by mouth daily.   lisinopril 10 MG tablet Commonly known as: ZESTRIL Take 2 tablets (20 mg total) by mouth daily.   OCREVUS IV Inject into the vein.   vitamin B-12 500 MCG tablet Commonly known as: CYANOCOBALAMIN Take 500 mcg by mouth daily.   Vitamin D3 50 MCG (2000 UT) capsule Take 4,000 Units by mouth daily.           Objective:   Physical Exam Ht 5\' 6"  (1.676 m)   Wt 215 lb (97.5 kg)   LMP 06/30/2018 (Approximate)   BMI 34.70 kg/m  This is a virtual video visit.  She is alert oriented x3, in no distress.    Assessment     Assessment A1C 5.8 Multiple sclerosis: neuro @ [facility].  DX 2003 Essential tremor HTN Pulmonary nodule, last CT 05-2015, stable, no further scans Gestational diabetes Proteinuria: Mild, chronic Contraception: Husband vasectomy  PLAN: UTI: Sx c/w a UTI, after a dose of Macrobid she had a fever, she thinks it might have been a reaction to the antibiotic and recalls that in the past she had an issue with that same antibiotic. To be safe will list Macrobid in her allergies but I am not 100% certain she had a reaction. Urinalysis today is clear, she has a still LUTS, likely she has a partially treated UTI. Plan: Cipro for 3 days, lots of fluids, urine culture pending, call if not gradually better. Allergic to Macrobid?:  See above  I discussed the assessment and treatment plan with the patient. The patient was provided an opportunity to ask questions and all were  answered. The patient agreed with the plan and demonstrated an understanding of the instructions.   The patient was advised to call back or seek an in-person evaluation if the symptoms worsen or if the condition fails to improve as anticipated.

## 2018-10-22 NOTE — Assessment & Plan Note (Signed)
UTI: Sx c/w a UTI, after a dose of Macrobid she had a fever, she thinks it might have been a reaction to the antibiotic and recalls that in the past she had an issue with that same antibiotic. To be safe will list Macrobid in her allergies but I am not 492% certain she had a reaction. Urinalysis today is clear, she has a still LUTS, likely she has a partially treated UTI. Plan: Cipro for 3 days, lots of fluids, urine culture pending, call if not gradually better. Allergic to Macrobid?:  See above

## 2018-10-23 LAB — URINE CULTURE
MICRO NUMBER:: 908418
SPECIMEN QUALITY:: ADEQUATE

## 2018-11-30 ENCOUNTER — Encounter: Payer: Self-pay | Admitting: Internal Medicine

## 2018-12-05 ENCOUNTER — Other Ambulatory Visit: Payer: Self-pay

## 2018-12-05 DIAGNOSIS — Z20822 Contact with and (suspected) exposure to covid-19: Secondary | ICD-10-CM

## 2018-12-06 LAB — NOVEL CORONAVIRUS, NAA: SARS-CoV-2, NAA: NOT DETECTED

## 2018-12-30 ENCOUNTER — Telehealth: Payer: Managed Care, Other (non HMO) | Admitting: Physician Assistant

## 2018-12-30 DIAGNOSIS — J329 Chronic sinusitis, unspecified: Secondary | ICD-10-CM

## 2018-12-30 MED ORDER — AMOXICILLIN-POT CLAVULANATE 875-125 MG PO TABS: 1.0000 | ORAL_TABLET | Freq: Two times a day (BID) | ORAL | 0 refills | Status: AC

## 2018-12-30 NOTE — Progress Notes (Signed)
We are sorry that you are not feeling well.  Here is how we plan to help!  Based on what you have shared with me it looks like you have sinusitis.  Sinusitis is inflammation and infection in the sinus cavities of the head.  Based on your presentation I believe you most likely have Acute Bacterial Sinusitis.  This is an infection caused by bacteria and is treated with antibiotics. I have prescribed Augmentin 875mg/125mg one tablet twice daily with food, for 7 days. You may use an oral decongestant such as Mucinex D or if you have glaucoma or high blood pressure use plain Mucinex. Saline nasal spray help and can safely be used as often as needed for congestion.  If you develop worsening sinus pain, fever or notice severe headache and vision changes, or if symptoms are not better after completion of antibiotic, please schedule an appointment with a health care provider.    Sinus infections are not as easily transmitted as other respiratory infection, however we still recommend that you avoid close contact with loved ones, especially the very young and elderly.  Remember to wash your hands thoroughly throughout the day as this is the number one way to prevent the spread of infection!  Home Care:  Only take medications as instructed by your medical team.  Complete the entire course of an antibiotic.  Do not take these medications with alcohol.  A steam or ultrasonic humidifier can help congestion.  You can place a towel over your head and breathe in the steam from hot water coming from a faucet.  Avoid close contacts especially the very young and the elderly.  Cover your mouth when you cough or sneeze.  Always remember to wash your hands.  Get Help Right Away If:  You develop worsening fever or sinus pain.  You develop a severe head ache or visual changes.  Your symptoms persist after you have completed your treatment plan.  Make sure you  Understand these instructions.  Will watch your  condition.  Will get help right away if you are not doing well or get worse.  Your e-visit answers were reviewed by a board certified advanced clinical practitioner to complete your personal care plan.  Depending on the condition, your plan could have included both over the counter or prescription medications.  If there is a problem please reply  once you have received a response from your provider.  Your safety is important to us.  If you have drug allergies check your prescription carefully.    You can use MyChart to ask questions about today's visit, request a non-urgent call back, or ask for a work or school excuse for 24 hours related to this e-Visit. If it has been greater than 24 hours you will need to follow up with your provider, or enter a new e-Visit to address those concerns.  You will get an e-mail in the next two days asking about your experience.  I hope that your e-visit has been valuable and will speed your recovery. Thank you for using e-visits.   Greater than 5 minutes, yet less than 10 minutes of time have been spent researching, coordinating and implementing care for this patient today.   

## 2019-01-05 ENCOUNTER — Other Ambulatory Visit (INDEPENDENT_AMBULATORY_CARE_PROVIDER_SITE_OTHER): Payer: Managed Care, Other (non HMO)

## 2019-01-05 DIAGNOSIS — Z0000 Encounter for general adult medical examination without abnormal findings: Secondary | ICD-10-CM

## 2019-01-05 LAB — FECAL OCCULT BLOOD, IMMUNOCHEMICAL: Fecal Occult Bld: NEGATIVE

## 2019-02-23 ENCOUNTER — Ambulatory Visit: Payer: Managed Care, Other (non HMO) | Attending: Internal Medicine

## 2019-02-23 DIAGNOSIS — Z20822 Contact with and (suspected) exposure to covid-19: Secondary | ICD-10-CM

## 2019-02-24 LAB — NOVEL CORONAVIRUS, NAA: SARS-CoV-2, NAA: NOT DETECTED

## 2019-03-09 ENCOUNTER — Other Ambulatory Visit: Payer: Self-pay | Admitting: Internal Medicine

## 2019-03-09 MED ORDER — LISINOPRIL 10 MG PO TABS: 20.0000 mg | ORAL_TABLET | Freq: Every day | ORAL | 1 refills | Status: DC

## 2019-03-17 ENCOUNTER — Encounter: Payer: Self-pay | Admitting: Internal Medicine

## 2019-03-18 ENCOUNTER — Other Ambulatory Visit: Payer: Managed Care, Other (non HMO)

## 2019-03-19 LAB — NOVEL CORONAVIRUS, NAA: SARS-CoV-2, NAA: DETECTED

## 2019-03-20 ENCOUNTER — Emergency Department (HOSPITAL_COMMUNITY)
Admission: EM | Admit: 2019-03-20 | Discharge: 2019-03-20 | Disposition: A | Discharge status: Home / Self Care | Payer: Managed Care, Other (non HMO) | Attending: Emergency Medicine | Admitting: Emergency Medicine

## 2019-03-20 ENCOUNTER — Encounter (HOSPITAL_COMMUNITY): Payer: Self-pay

## 2019-03-20 ENCOUNTER — Other Ambulatory Visit: Payer: Self-pay

## 2019-03-20 ENCOUNTER — Emergency Department (HOSPITAL_COMMUNITY): Payer: Managed Care, Other (non HMO)

## 2019-03-20 DIAGNOSIS — R109 Unspecified abdominal pain: Secondary | ICD-10-CM

## 2019-03-20 DIAGNOSIS — R1032 Left lower quadrant pain: Secondary | ICD-10-CM | POA: Insufficient documentation

## 2019-03-20 DIAGNOSIS — Z87891 Personal history of nicotine dependence: Secondary | ICD-10-CM | POA: Diagnosis not present

## 2019-03-20 DIAGNOSIS — I10 Essential (primary) hypertension: Secondary | ICD-10-CM | POA: Diagnosis not present

## 2019-03-20 DIAGNOSIS — J189 Pneumonia, unspecified organism: Secondary | ICD-10-CM | POA: Diagnosis not present

## 2019-03-20 DIAGNOSIS — Z20822 Contact with and (suspected) exposure to covid-19: Secondary | ICD-10-CM | POA: Diagnosis not present

## 2019-03-20 DIAGNOSIS — R350 Frequency of micturition: Secondary | ICD-10-CM | POA: Insufficient documentation

## 2019-03-20 DIAGNOSIS — R1031 Right lower quadrant pain: Secondary | ICD-10-CM | POA: Insufficient documentation

## 2019-03-20 DIAGNOSIS — Z79899 Other long term (current) drug therapy: Secondary | ICD-10-CM | POA: Insufficient documentation

## 2019-03-20 DIAGNOSIS — G35 Multiple sclerosis: Secondary | ICD-10-CM | POA: Insufficient documentation

## 2019-03-20 LAB — URINALYSIS, ROUTINE W REFLEX MICROSCOPIC
Bilirubin Urine: NEGATIVE
Glucose, UA: NEGATIVE mg/dL
Hgb urine dipstick: NEGATIVE
Ketones, ur: NEGATIVE mg/dL
Leukocytes,Ua: NEGATIVE
Nitrite: NEGATIVE
Protein, ur: NEGATIVE mg/dL
Specific Gravity, Urine: 1.024 (ref 1.005–1.030)
pH: 5 (ref 5.0–8.0)

## 2019-03-20 LAB — CBC WITH DIFFERENTIAL/PLATELET
Abs Immature Granulocytes: 0.02 10*3/uL (ref 0.00–0.07)
Basophils Absolute: 0 10*3/uL (ref 0.0–0.1)
Basophils Relative: 0 %
Eosinophils Absolute: 0 10*3/uL (ref 0.0–0.5)
Eosinophils Relative: 0 %
HCT: 46 % (ref 36.0–46.0)
Hemoglobin: 14.8 g/dL (ref 12.0–15.0)
Immature Granulocytes: 0 %
Lymphocytes Relative: 16 %
Lymphs Abs: 0.8 10*3/uL (ref 0.7–4.0)
MCH: 27.7 pg (ref 26.0–34.0)
MCHC: 32.2 g/dL (ref 30.0–36.0)
MCV: 86.1 fL (ref 80.0–100.0)
Monocytes Absolute: 0.6 10*3/uL (ref 0.1–1.0)
Monocytes Relative: 12 %
Neutro Abs: 3.6 10*3/uL (ref 1.7–7.7)
Neutrophils Relative %: 72 %
Platelets: 189 10*3/uL (ref 150–400)
RBC: 5.34 MIL/uL — ABNORMAL HIGH (ref 3.87–5.11)
RDW: 12.7 % (ref 11.5–15.5)
WBC: 5 10*3/uL (ref 4.0–10.5)
nRBC: 0 % (ref 0.0–0.2)

## 2019-03-20 LAB — BASIC METABOLIC PANEL
Anion gap: 11 (ref 5–15)
BUN: 8 mg/dL (ref 6–20)
CO2: 24 mmol/L (ref 22–32)
Calcium: 9 mg/dL (ref 8.9–10.3)
Chloride: 102 mmol/L (ref 98–111)
Creatinine, Ser: 0.87 mg/dL (ref 0.44–1.00)
GFR calc Af Amer: >60 mL/min (ref >60)
GFR calc non Af Amer: >60 mL/min (ref >60)
Glucose, Bld: 98 mg/dL (ref 70–99)
Potassium: 3.7 mmol/L (ref 3.5–5.1)
Sodium: 137 mmol/L (ref 135–145)

## 2019-03-20 LAB — POC URINE PREG, ED: Preg Test, Ur: NEGATIVE

## 2019-03-20 MED ORDER — ACETAMINOPHEN 500 MG PO TABS
1000.0000 mg | ORAL_TABLET | Freq: Once | ORAL | Status: AC
  Administered 2019-03-20: 1000 mg via ORAL
  Filled 2019-03-20: qty 2

## 2019-03-20 MED ORDER — KETOROLAC TROMETHAMINE 15 MG/ML IJ SOLN: 15.0000 mg | Freq: Once | INTRAMUSCULAR | Status: DC

## 2019-03-20 MED ORDER — DOXYCYCLINE HYCLATE 100 MG PO CAPS: 100.0000 mg | ORAL_CAPSULE | Freq: Two times a day (BID) | ORAL | 0 refills | Status: DC

## 2019-03-20 MED ORDER — AMOXICILLIN 500 MG PO CAPS: 1000.0000 mg | ORAL_CAPSULE | Freq: Three times a day (TID) | ORAL | 0 refills | Status: DC

## 2019-03-20 NOTE — ED Notes (Signed)
Pt returned from CT at this time. Placed pt back on monitor, pt placed in position of comfort and advised of wait status.   

## 2019-03-20 NOTE — ED Notes (Signed)
Pt discharged at this time. Discharge instructions reviewed, pt verbalizes understanding. Questions answered.

## 2019-03-20 NOTE — ED Triage Notes (Signed)
Pt reports right lower back pain that started 3 days ago, denies n/v/d. :Last Bm this morning . Pt states her husband tested positive for COVID last week. Pt denies SOB or cough. Pt a.o, nad noted, pt ambulatory

## 2019-03-20 NOTE — ED Notes (Signed)
Pt transported to CT at this time.

## 2019-03-20 NOTE — Discharge Instructions (Signed)
Take antibiotics as discussed. Follow-up your Covid test result. Isolate as discussed until you get that result and your symptoms improve Return for shortness of breath or new or worsening symptoms.

## 2019-03-20 NOTE — ED Provider Notes (Signed)
[facility] EMERGENCY DEPARTMENT Provider Note   CSN: [id number] Arrival date & time: 03/20/19  8588     History Chief Complaint  Patient presents with  . Back Pain    Maria Graves is a 49 y.o. female.  Patient with history of MS on regular treatments through [facility] receives infusions presents with flank pain and lower back pain for the last 3 days.  No history of similar.  No injuries.  Fairly constant.  No history of kidney stones.  Unsure if she is had a urine infection in the past.  Urinary frequency.  No history of abdominal pelvic surgeries.  Patient denies any weakness in her legs or bowel or bladder changes except for frequency.  Patient was exposed to her husband who had Covid approximately 2 weeks ago and she had a fever for 1 day and mild fatigue that has since resolved.  Patient has a Covid test pending outpatient.        Past Medical History:  Diagnosis Date  . Contraception    husband vasectomy  . Essential tremor   . Gestational diabetes   . HTN (hypertension)   . MS (multiple sclerosis) ([place])   . Proteinuria    Reports mild, chronic proteinuria    Patient Active Problem List   Diagnosis Date Noted  . PCP NOTES >>> 11/16/2014  . Right knee injury 06/25/2014  . Multiple sclerosis ([place]) 10/01/2012  . Annual physical exam 10/01/2012  . Solitary pulmonary nodule 10/01/2012  . HTN (hypertension) 10/01/2012    Past Surgical History:  Procedure Laterality Date  . TONSILLECTOMY  1991     OB History    Gravida  3   Para  2   Term      Preterm      AB  1   Living  2     SAB      TAB      Ectopic      Multiple      Live Births              Family History  Problem Relation Age of Onset  . Arthritis Mother   . Alcohol abuse Mother   . Hypertension Brother   . Heart disease Paternal Grandfather   . Heart attack Paternal Grandfather   . Stroke Paternal Grandmother   . Diabetes Father   .  Clotting disorder Father   . Diabetes Maternal Grandfather   . Sudden death Other        uncle, GF  . Colon cancer Neg Hx   . Breast cancer Neg Hx     Social History   Tobacco Use  . Smoking status: Former Smoker    Quit date: 02/11/2017    Years since quitting: 2.1  . Smokeless tobacco: Never Used  . Tobacco comment: vaping as off 05-2017  Substance Use Topics  . Alcohol use: Yes    Alcohol/week: 0.0 standard drinks    Comment: Socially  . Drug use: No    Home Medications Prior to Admission medications   Medication Sig Start Date End Date Taking? Authorizing Provider  amoxicillin (AMOXIL) 500 MG capsule Take 2 capsules (1,000 mg total) by mouth 3 (three) times daily for 5 days. 03/20/19 03/25/19  Elnora Morrison, MD  BEE POLLEN PO Take by mouth.    [provider]  Cholecalciferol (VITAMIN D3) 2000 units capsule Take 4,000 Units by mouth daily.    [provider]  ciprofloxacin (CIPRO) 500 MG tablet Take 1 tablet (500 mg total) by mouth 2 (two) times daily. 10/21/18   Wanda Plump, MD  doxycycline (VIBRAMYCIN) 100 MG capsule Take 1 capsule (100 mg total) by mouth 2 (two) times daily. One po bid x 7 days 03/20/19   Blane Ohara, MD  famotidine (PEPCID) 10 MG tablet Take 10 mg by mouth daily.    [provider]  fexofenadine (ALLEGRA) 180 MG tablet Take 180 mg by mouth daily.    [provider]  lisinopril (ZESTRIL) 10 MG tablet Take 2 tablets (20 mg total) by mouth daily. 03/09/19   Wanda Plump, MD  Ocrelizumab (OCREVUS IV) Inject into the vein.    [provider]  vitamin B-12 (CYANOCOBALAMIN) 500 MCG tablet Take 500 mcg by mouth daily.    [provider]    Allergies    Erythromycin base, Sulfa antibiotics, Tetanus toxoids, and Macrobid [nitrofurantoin macrocrystal]  Review of Systems   Review of Systems  Constitutional: Positive for fatigue. Negative for chills.  HENT: Negative for congestion.   Eyes: Negative for visual  disturbance.  Respiratory: Negative for shortness of breath.   Cardiovascular: Negative for chest pain.  Gastrointestinal: Negative for abdominal pain and vomiting.  Genitourinary: Positive for flank pain. Negative for dysuria.  Musculoskeletal: Negative for back pain, neck pain and neck stiffness.  Skin: Negative for rash.  Neurological: Negative for weakness, light-headedness and headaches.    Physical Exam Updated Vital Signs BP 134/82   Pulse 82   Temp 98.4 F (36.9 C) (Oral)   Resp 18   Ht 5\' 7"  (1.702 m)   Wt 97.7 kg   SpO2 99%   BMI 33.73 kg/m   Physical Exam Vitals and nursing note reviewed.  Constitutional:      Appearance: She is well-developed.  HENT:     Head: Normocephalic and atraumatic.  Eyes:     General:        Right eye: No discharge.        Left eye: No discharge.     Conjunctiva/sclera: Conjunctivae normal.  Neck:     Trachea: No tracheal deviation.  Cardiovascular:     Rate and Rhythm: Normal rate and regular rhythm.  Pulmonary:     Effort: Pulmonary effort is normal.     Breath sounds: Normal breath sounds.  Abdominal:     General: There is no distension.     Palpations: Abdomen is soft.     Tenderness: There is no abdominal tenderness. There is no guarding.  Musculoskeletal:        General: Tenderness present. No swelling.     Cervical back: Normal range of motion and neck supple.     Comments: Patient has mild tenderness bilateral SI joints and flank without external rash.  No midline lumbar or thoracic tenderness.  Patient has normal 5+ strength with flexion extension of hips knees and ankles bilateral.  Skin:    General: Skin is warm.     Capillary Refill: Capillary refill takes less than 2 seconds.     Findings: No rash.  Neurological:     General: No focal deficit present.     Mental Status: She is alert and oriented to person, place, and time.     ED Results / Procedures / Treatments   Labs (all labs ordered are listed, but  only abnormal results are displayed) Labs Reviewed  URINALYSIS, ROUTINE W REFLEX MICROSCOPIC - Abnormal; Notable for the following components:  Result Value   APPearance CLOUDY (*)    All other components within normal limits  CBC WITH DIFFERENTIAL/PLATELET - Abnormal; Notable for the following components:   RBC 5.34 (*)    All other components within normal limits  BASIC METABOLIC PANEL  POC URINE PREG, ED    EKG None  Radiology CT Renal Stone Study  Result Date: 03/20/2019 CLINICAL DATA:  Right flank pain EXAM: CT ABDOMEN AND PELVIS WITHOUT CONTRAST TECHNIQUE: Multidetector CT imaging of the abdomen and pelvis was performed following the standard protocol without IV contrast. COMPARISON:  08/30/2016 FINDINGS: Lower chest: Small focal ground-glass opacity within the posterior aspect of the right lung base. Benign juxtapleural nodule within the posterior left lower lobe measures 5 mm (series 5, image 15) and is less conspicuous compared to prior. Hepatobiliary: No focal liver abnormality is seen. No gallstones, gallbladder wall thickening, or biliary dilatation. Pancreas: Unremarkable. No pancreatic ductal dilatation or surrounding inflammatory changes. Spleen: Normal in size without focal abnormality. Adrenals/Urinary Tract: Slightly nodular configuration of the left adrenal gland is unchanged. Right adrenal gland is unremarkable. Bilateral kidneys are unremarkable in appearance. No renal calculi or hydronephrosis. Nondilated ureters. No ureteral calculi identified. Multiple pelvic phleboliths. Urinary bladder unremarkable for the degree of distension. Stomach/Bowel: Stomach is within normal limits. Appendix appears normal. No evidence of bowel wall thickening, distention, or inflammatory changes. Vascular/Lymphatic: Mild scattered atherosclerotic calcifications of the abdominal aorta. No aneurysm. No abdominopelvic lymphadenopathy. Reproductive: Retroverted uterus. Simple appearing 2.2 cm  left adnexal cyst. Right ovary unremarkable. Other: No abdominopelvic ascites. Small fat containing umbilical hernia. Musculoskeletal: Degenerative disc disease of L4-5. No acute osseous findings. IMPRESSION: 1. No evidence of obstructive uropathy. 2. Small focal ground-glass opacity within the posterior aspect of the right lung base suggestive of pneumonia. 3. Additional chronic and/or incidental findings, as above. Aortic Atherosclerosis (ICD10-I70.0). Electronically Signed   By: Duanne Guess D.O.   On: 03/20/2019 10:46    Procedures Procedures (including critical care time)  Medications Ordered in ED Medications  ketorolac (TORADOL) 15 MG/ML injection 15 mg (15 mg Intravenous Not Given 03/20/19 1044)  acetaminophen (TYLENOL) tablet 1,000 mg (1,000 mg Oral Given 03/20/19 3419)    ED Course  I have reviewed the triage vital signs and the nursing notes.  Pertinent labs & imaging results that were available during my care of the patient were reviewed by me and considered in my medical decision making (see chart for details).    MDM Rules/Calculators/A&P                     Patient presents with worsening persistent flank and lower back sacral tenderness.  Discussed possibility of arthritis/musculoskeletal versus urinary infection versus kidney stone versus other.  Patient has outpatient follow-up for her MS.  With new symptoms plan for CT scan for further delineation look for any kidney stone, blood work to check kidney function, urinalysis and pain meds as needed.  Patient had Covid test recently outpatient results are pending.  Patient's blood work reviewed normal white blood cell count, normal hemoglobin, normal kidney function, normal electrolytes.  Vital signs unremarkable in the ER.  Urinalysis no sign of infection. CT scan results reviewed consistent with pneumonia.  This may be bacterial versus Covid.  Plan to start oral antibiotics, follow-up outpatient Covid test and primary care  doctor follow-up.  Pain meds given the ER.  FEDRA LANTER was evaluated in Emergency Department on 03/20/2019 for the symptoms described in the history of present  illness. She was evaluated in the context of the global COVID-19 pandemic, which necessitated consideration that the patient might be at risk for infection with the SARS-CoV-2 virus that causes COVID-19. Institutional protocols and algorithms that pertain to the evaluation of patients at risk for COVID-19 are in a state of rapid change based on information released by regulatory bodies including the CDC and federal and state organizations. These policies and algorithms were followed during the patient's care in the ED.  Final Clinical Impression(s) / ED Diagnoses Final diagnoses:  Bilateral flank pain  Urinary frequency  Exposure to COVID-19 virus  Community acquired pneumonia of right lower lobe of lung    Rx / DC Orders ED Discharge Orders         Ordered    amoxicillin (AMOXIL) 500 MG capsule  3 times daily     03/20/19 1115    doxycycline (VIBRAMYCIN) 100 MG capsule  2 times daily     03/20/19 1115           Blane Ohara, MD 03/20/19 1116

## 2019-03-23 ENCOUNTER — Ambulatory Visit (INDEPENDENT_AMBULATORY_CARE_PROVIDER_SITE_OTHER): Payer: Managed Care, Other (non HMO) | Admitting: Internal Medicine

## 2019-03-23 ENCOUNTER — Other Ambulatory Visit: Payer: Self-pay

## 2019-03-23 ENCOUNTER — Encounter: Payer: Self-pay | Admitting: Internal Medicine

## 2019-03-23 VITALS — Ht 66.0 in

## 2019-03-23 DIAGNOSIS — U071 COVID-19: Secondary | ICD-10-CM | POA: Diagnosis not present

## 2019-03-23 DIAGNOSIS — G35 Multiple sclerosis: Secondary | ICD-10-CM

## 2019-03-23 MED ORDER — AZITHROMYCIN 250 MG PO TABS: ORAL_TABLET | ORAL | 0 refills | Status: DC

## 2019-03-23 NOTE — Progress Notes (Signed)
Pre visit review using our clinic review tool, if applicable. No additional management support is needed unless otherwise documented below in the visit note. 

## 2019-03-23 NOTE — Progress Notes (Signed)
Subjective:    Patient ID: Maria Graves, female    DOB: 1970-11-24, 49 y.o.   MRN: [id number]  DOS:  03/23/2019 Type of visit - description: Virtual Visit via Video Note  I connected with the above patient  by a video enabled telemedicine application and verified that I am speaking with the correct person using two identifiers.   THIS ENCOUNTER IS A VIRTUAL VISIT DUE TO COVID-19 - PATIENT WAS NOT SEEN IN THE OFFICE. PATIENT HAS CONSENTED TO VIRTUAL VISIT / TELEMEDICINE VISIT   Location of patient: home  Location of provider: office  I discussed the limitations of evaluation and management by telemedicine and the availability of in person appointments. The patient expressed understanding and agreed to proceed.  ER follow-up Symptoms started 03/12/2019: Fever, chills on and off, lost of taste and smell. She also developed generalized aches particularly around the hips, back and "tailbone". She tested positive for Covid 03/19/2019. Went to the ER 03/20/2019 with above symptoms. Work-up included:  CBC, BMP, UA essentially unremarkable. CT renal was done to r/o possibly kidney stone but showed a small focal groundglass opacity at the posterior right lung base.  Review of Systems Currently is at home, resting, temperature now 99.7.  Took Tylenol about 10 hours ago. O2 sats 100% No chest pain no difficulty breathing Has developed diarrhea and some nausea without vomiting shortly after started antibiotics.  Past Medical History:  Diagnosis Date  . Contraception    husband vasectomy  . Essential tremor   . Gestational diabetes   . HTN (hypertension)   . MS (multiple sclerosis) ([facility])   . Proteinuria    Reports mild, chronic proteinuria    Past Surgical History:  Procedure Laterality Date  . TONSILLECTOMY  1991    Allergies as of 03/23/2019      Reactions   Erythromycin Base Nausea Only   Sulfa Antibiotics Hives   Bactrim caused Hives   Tetanus Toxoids Swelling   As a  child   Macrobid [nitrofurantoin Macrocrystal] Other (See Comments)   Caused fever? See  OV 10/21/2018      Medication List       Accurate as of March 23, 2019  1:05 PM. If you have any questions, ask your nurse or doctor.        amoxicillin 500 MG capsule Commonly known as: AMOXIL Take 2 capsules (1,000 mg total) by mouth 3 (three) times daily for 5 days.   BEE POLLEN PO Take by mouth.   ciprofloxacin 500 MG tablet Commonly known as: CIPRO Take 1 tablet (500 mg total) by mouth 2 (two) times daily.   doxycycline 100 MG capsule Commonly known as: VIBRAMYCIN Take 1 capsule (100 mg total) by mouth 2 (two) times daily. One po bid x 7 days   famotidine 10 MG tablet Commonly known as: PEPCID Take 10 mg by mouth daily.   fexofenadine 180 MG tablet Commonly known as: ALLEGRA Take 180 mg by mouth daily.   lisinopril 10 MG tablet Commonly known as: ZESTRIL Take 2 tablets (20 mg total) by mouth daily.   OCREVUS IV Inject into the vein.   vitamin B-12 500 MCG tablet Commonly known as: CYANOCOBALAMIN Take 500 mcg by mouth daily.   Vitamin D3 50 MCG (2000 UT) capsule Take 4,000 Units by mouth daily.             Objective:   Physical Exam Ht 5\' 6"  (1.676 m)   BMI 34.76 kg/m  This is a  virtual video visit, she is alert oriented x3, no apparent distress    Assessment       Assessment A1C 5.8 Multiple sclerosis: neuro @ [place].  DX 2003 Essential tremor HTN Pulmonary nodule, last CT 05-2015, stable, no further scans Gestational diabetes Proteinuria: Mild, chronic Contraception: Husband vasectomy  PLAN: COVID-19: Symptoms of started 03/12/2019, tested positive on 03/19/2019 and went to the ER the next day. Blood work and urinalysis unremarkable but CT done to rule out a kidney stone showed ground glass opacity at the right lung. She was diagnosed with pneumonia, prescribed amoxicillin and doxycycline. Temperature today 99.7, O2 sat is steady at around 100%.  She continue with aches and pains. Has developed diarrhea since he started antibiotics. Plan: Switch to Zithromax, rest, fluids, continue checking O2 sats, seek attention if it drops to 94% or less. If she is not gradually better let me know. Follow-up here 3 weeks in person. MS: Last injection of Ocrevus was 02/02/2019, recommend to notify neurology about her illness. RTC 3 weeks    COVID-19: Symptoms of started 03/12/2019, tested positive on 03/19/2019 and went to the ER the next day. Blood work and urinalysis unremarkable but CT done to rule out a kidney stone showed ground glass opacity at the right lung. She was diagnosed with pneumonia, prescribed amoxicillin and doxycycline. Temperature today 99.7, O2 sat is steady at around 100%. She continue with aches and pains. Has developed diarrhea since he started antibiotics. Plan: Switch to Zithromax, rest, fluids, continue checking O2 sats, seek attention if it drops to 94% or less. If she is not gradually better let me know. Follow-up here 3 weeks in person. MS: Last injection of Ocrevus was 02/02/2019, recommend to notify neurology about her illness. RTC 3 weeks    I discussed the assessment and treatment plan with the patient. The patient was provided an opportunity to ask questions and all were answered. The patient agreed with the plan and demonstrated an understanding of the instructions.   The patient was advised to call back or seek an in-person evaluation if the symptoms worsen or if the condition fails to improve as anticipated.

## 2019-03-24 NOTE — Assessment & Plan Note (Signed)
COVID-19: Symptoms of started 03/12/2019, tested positive on 03/19/2019 and went to the ER the next day. Blood work and urinalysis unremarkable but CT done to rule out a kidney stone showed ground glass opacity at the right lung. She was diagnosed with pneumonia, prescribed amoxicillin and doxycycline. Temperature today 99.7, O2 sat is steady at around 100%. She continue with aches and pains. Has developed diarrhea since he started antibiotics. Plan: Switch to Zithromax, rest, fluids, continue checking O2 sats, seek attention if it drops to 94% or less. If she is not gradually better let me know. Follow-up here 3 weeks in person. MS: Last injection of Ocrevus was 02/02/2019, recommend to notify neurology about her illness. RTC 3 weeks

## 2019-03-31 ENCOUNTER — Encounter: Payer: Self-pay | Admitting: Internal Medicine

## 2019-03-31 ENCOUNTER — Other Ambulatory Visit: Payer: Self-pay

## 2019-03-31 ENCOUNTER — Ambulatory Visit (INDEPENDENT_AMBULATORY_CARE_PROVIDER_SITE_OTHER): Payer: Managed Care, Other (non HMO) | Admitting: Internal Medicine

## 2019-03-31 VITALS — Ht 66.0 in | Wt 207.0 lb

## 2019-03-31 DIAGNOSIS — R42 Dizziness and giddiness: Secondary | ICD-10-CM

## 2019-03-31 DIAGNOSIS — R197 Diarrhea, unspecified: Secondary | ICD-10-CM

## 2019-03-31 DIAGNOSIS — U071 COVID-19: Secondary | ICD-10-CM

## 2019-03-31 NOTE — Progress Notes (Signed)
Pre visit review using our clinic review tool, if applicable. No additional management support is needed unless otherwise documented below in the visit note. 

## 2019-03-31 NOTE — Progress Notes (Signed)
Subjective:    Patient ID: Maria Graves, female    DOB: 1971-01-24, 49 y.o.   MRN: [id number]  DOS:  03/31/2019 Type of visit - description:  Virtual Visit via Video Note  I connected with the above patient  by a video enabled telemedicine application and verified that I am speaking with the correct person using two identifiers.   THIS ENCOUNTER IS A VIRTUAL VISIT DUE TO COVID-19 - PATIENT WAS NOT SEEN IN THE OFFICE. PATIENT HAS CONSENTED TO VIRTUAL VISIT / TELEMEDICINE VISIT   Location of patient: home  Location of provider: office  I discussed the limitations of evaluation and management by telemedicine and the availability of in person appointments. The patient expressed understanding and agreed to proceed.  Acute Her main concern is diarrhea. It  started shortly after she went to the emergency room and was prescribed antibiotics. Initially diarrhea was watery, 5 or 6 times a day, amount of each BM around 1 cup.  Currently, stools are mixed of loose and watery. She is following a brat diet, taking Imodium, probiotics with mild improvement.  Also having steady dizziness without headache.  Slightly worse when she stands up. She reports her appetite is okay and she is drinking a very good amount of fluids.  Good urine output, often times the urine is clear.  (Likely not dehydrated)  No fever chills Some nausea no vomiting. No blood in the stools although she saw some fresh blood after a bowel movement she thinks related to external irritation. No ambulatory BPs but O2 sats 100%.  Review of Systems See above   Past Medical History:  Diagnosis Date  . Contraception    husband vasectomy  . Essential tremor   . Gestational diabetes   . HTN (hypertension)   . MS (multiple sclerosis) ([facility])   . Proteinuria    Reports mild, chronic proteinuria    Past Surgical History:  Procedure Laterality Date  . TONSILLECTOMY  1991    Allergies as of 03/31/2019      Reactions   Erythromycin Base Nausea Only   Sulfa Antibiotics Hives   Bactrim caused Hives   Tetanus Toxoids Swelling   As a child   Macrobid [nitrofurantoin Macrocrystal] Other (See Comments)   Caused fever? See  OV 10/21/2018      Medication List       Accurate as of March 31, 2019 11:59 PM. If you have any questions, ask your nurse or doctor.        STOP taking these medications   azithromycin 250 MG tablet Commonly known as: Zithromax Z-Pak Stopped by: Willow Ora, MD     TAKE these medications   BEE POLLEN PO Take by mouth.   famotidine 10 MG tablet Commonly known as: PEPCID Take 10 mg by mouth daily.   fexofenadine 180 MG tablet Commonly known as: ALLEGRA Take 180 mg by mouth daily.   lisinopril 10 MG tablet Commonly known as: ZESTRIL Take 2 tablets (20 mg total) by mouth daily.   OCREVUS IV Inject into the vein.   Probiotic 1-250 BILLION-MG Caps   vitamin B-12 500 MCG tablet Commonly known as: CYANOCOBALAMIN Take 500 mcg by mouth daily.   Vitamin D3 50 MCG (2000 UT) capsule Take 4,000 Units by mouth daily.             Objective:   Physical Exam Ht 5\' 6"  (1.676 m)   Wt 207 lb (93.9 kg)   BMI 33.41 kg/m  This is  a virtual video visit, she is alert oriented x3, in no apparent distress    Assessment     Assessment A1C 5.8 Multiple sclerosis: neuro @ [place].  DX 2003 Essential tremor HTN Pulmonary nodule, last CT 05-2015, stable, no further scans Gestational diabetes Proteinuria: Mild, chronic Contraception: Husband vasectomy  PLAN: Diarrhea: Diarrhea started shortly after she was prescribed antibiotics for pneumonia in the context of Covid infection. She is afebrile, essentially no respiratory symptoms, O2 sats 100%. She just finished Zithromax. She does not seem to be dehydrated. Plan: CMP, CBC, stool study for pathogens including C. difficile. Continue with a brat diet, probiotics.  Further advised with results. Dizziness: Reports dizziness since  diarrhea started however she does not seem to be dehydrated with good p.o. intake and urinary output, she is on lisinopril, no ambulatory BPs.  Observation for now.  Checking labs including a BUN and creatinine.   I discussed the assessment and treatment plan with the patient. The patient was provided an opportunity to ask questions and all were answered. The patient agreed with the plan and demonstrated an understanding of the instructions.   The patient was advised to call back or seek an in-person evaluation if the symptoms worsen or if the condition fails to improve as anticipated.

## 2019-04-01 NOTE — Assessment & Plan Note (Signed)
Diarrhea: Diarrhea started shortly after she was prescribed antibiotics for pneumonia in the context of Covid infection. She is afebrile, essentially no respiratory symptoms, O2 sats 100%. She just finished Zithromax. She does not seem to be dehydrated. Plan: CMP, CBC, stool study for pathogens including C. difficile. Continue with a brat diet, probiotics.  Further advised with results. Dizziness: Reports dizziness since diarrhea started however she does not seem to be dehydrated with good p.o. intake and urinary output, she is on lisinopril, no ambulatory BPs.  Observation for now.  Checking labs including a BUN and creatinine.

## 2019-04-03 ENCOUNTER — Other Ambulatory Visit: Payer: Self-pay

## 2019-04-03 ENCOUNTER — Other Ambulatory Visit (INDEPENDENT_AMBULATORY_CARE_PROVIDER_SITE_OTHER): Payer: Managed Care, Other (non HMO)

## 2019-04-03 DIAGNOSIS — R197 Diarrhea, unspecified: Secondary | ICD-10-CM

## 2019-04-03 LAB — CBC WITH DIFFERENTIAL/PLATELET
Basophils Absolute: 0.1 10*3/uL (ref 0.0–0.1)
Basophils Relative: 0.7 % (ref 0.0–3.0)
Eosinophils Absolute: 0.2 10*3/uL (ref 0.0–0.7)
Eosinophils Relative: 2.1 % (ref 0.0–5.0)
HCT: 40.6 % (ref 36.0–46.0)
Hemoglobin: 13.6 g/dL (ref 12.0–15.0)
Lymphocytes Relative: 27.2 % (ref 12.0–46.0)
Lymphs Abs: 2.8 10*3/uL (ref 0.7–4.0)
MCHC: 33.6 g/dL (ref 30.0–36.0)
MCV: 83.7 fl (ref 78.0–100.0)
Monocytes Absolute: 0.8 10*3/uL (ref 0.1–1.0)
Monocytes Relative: 7.8 % (ref 3.0–12.0)
Neutro Abs: 6.4 10*3/uL (ref 1.4–7.7)
Neutrophils Relative %: 62.2 % (ref 43.0–77.0)
Platelets: 319 10*3/uL (ref 150.0–400.0)
RBC: 4.85 Mil/uL (ref 3.87–5.11)
RDW: 13 % (ref 11.5–15.5)
WBC: 10.4 10*3/uL (ref 4.0–10.5)

## 2019-04-03 LAB — COMPREHENSIVE METABOLIC PANEL
ALT: 49 U/L — ABNORMAL HIGH (ref 0–35)
AST: 20 U/L (ref 0–37)
Albumin: 4 g/dL (ref 3.5–5.2)
Alkaline Phosphatase: 74 U/L (ref 39–117)
BUN: 12 mg/dL (ref 6–23)
CO2: 28 mEq/L (ref 19–32)
Calcium: 9.4 mg/dL (ref 8.4–10.5)
Chloride: 103 mEq/L (ref 96–112)
Creatinine, Ser: 0.67 mg/dL (ref 0.40–1.20)
GFR: 92.79 mL/min (ref >60.00)
Glucose, Bld: 113 mg/dL — ABNORMAL HIGH (ref 70–99)
Potassium: 4.5 mEq/L (ref 3.5–5.1)
Sodium: 138 mEq/L (ref 135–145)
Total Bilirubin: 0.5 mg/dL (ref 0.2–1.2)
Total Protein: 6.6 g/dL (ref 6.0–8.3)

## 2019-04-03 NOTE — Addendum Note (Signed)
Addended by: Harley Alto on: 04/03/2019 10:17 AM   Modules accepted: Orders

## 2019-04-03 NOTE — Addendum Note (Signed)
Addended by: PRICE, KRISTY M on: 04/03/2019 10:17 AM   Modules accepted: Orders  

## 2019-04-13 ENCOUNTER — Other Ambulatory Visit: Payer: Self-pay

## 2019-04-14 ENCOUNTER — Ambulatory Visit: Payer: Managed Care, Other (non HMO) | Admitting: Internal Medicine

## 2019-04-14 ENCOUNTER — Encounter: Payer: Self-pay | Admitting: Internal Medicine

## 2019-04-14 ENCOUNTER — Other Ambulatory Visit: Payer: Self-pay

## 2019-04-14 ENCOUNTER — Ambulatory Visit (HOSPITAL_BASED_OUTPATIENT_CLINIC_OR_DEPARTMENT_OTHER)
Admission: RE | Admit: 2019-04-14 | Discharge: 2019-04-14 | Disposition: A | Discharge status: Home / Self Care | Payer: Managed Care, Other (non HMO) | Source: Ambulatory Visit | Attending: Internal Medicine | Admitting: Internal Medicine

## 2019-04-14 VITALS — BP 140/90 | HR 79 | Temp 97.2°F | Resp 16 | Ht 66.0 in | Wt 209.4 lb

## 2019-04-14 DIAGNOSIS — R197 Diarrhea, unspecified: Secondary | ICD-10-CM | POA: Diagnosis not present

## 2019-04-14 DIAGNOSIS — R42 Dizziness and giddiness: Secondary | ICD-10-CM

## 2019-04-14 DIAGNOSIS — U071 COVID-19: Secondary | ICD-10-CM | POA: Diagnosis not present

## 2019-04-14 DIAGNOSIS — Z8701 Personal history of pneumonia (recurrent): Secondary | ICD-10-CM

## 2019-04-14 NOTE — Progress Notes (Signed)
Pre visit review using our clinic review tool, if applicable. No additional management support is needed unless otherwise documented below in the visit note. 

## 2019-04-14 NOTE — Patient Instructions (Signed)
   STOP BY THE FIRST FLOOR:  get the XR    See you in May

## 2019-04-14 NOTE — Progress Notes (Signed)
Subjective:    Patient ID: Maria Graves, female    DOB: 1970-04-08, 49 y.o.   MRN: [id number]  DOS:  04/14/2019 Type of visit - description: Follow-up  Was diagnosed with Covid 03/2019, was evaluated at the ER, blood work unremarkable, CT renal showed a gram glass opacity at the base of the right lung. Subsequently was seen 03/31/2019, had diarrhea, dizziness. Here for follow-up  Overall she feels better. Diarrhea quickly resolved after the last visit. Dizziness essentially resolved No fever chills.  No cough No chest pain no difficulty breathing.  Review of Systems See above   Past Medical History:  Diagnosis Date  . Contraception    husband vasectomy  . Essential tremor   . Gestational diabetes   . HTN (hypertension)   . MS (multiple sclerosis) (HCC)   . Proteinuria    Reports mild, chronic proteinuria    Past Surgical History:  Procedure Laterality Date  . TONSILLECTOMY  1991    Allergies as of 04/14/2019      Reactions   Erythromycin Base Nausea Only   Sulfa Antibiotics Hives   Bactrim caused Hives   Tetanus Toxoids Swelling   As a child   Macrobid [nitrofurantoin Macrocrystal] Other (See Comments)   Caused fever? See  OV 10/21/2018      Medication List       Accurate as of April 14, 2019 11:59 PM. If you have any questions, ask your nurse or doctor.        STOP taking these medications   BEE POLLEN PO Stopped by: Willow Ora, MD   vitamin B-12 500 MCG tablet Commonly known as: CYANOCOBALAMIN Stopped by: Willow Ora, MD     TAKE these medications   famotidine 10 MG tablet Commonly known as: PEPCID Take 10 mg by mouth daily.   fexofenadine 180 MG tablet Commonly known as: ALLEGRA Take 180 mg by mouth daily.   lisinopril 10 MG tablet Commonly known as: ZESTRIL Take 2 tablets (20 mg total) by mouth daily.   OCREVUS IV Inject into the vein.   Probiotic 1-250 BILLION-MG Caps   Vitamin D3 50 MCG (2000 UT) capsule Take 4,000 Units by mouth  daily.          Objective:   Physical Exam BP 140/90 (BP Location: Left Arm, Patient Position: Sitting, Cuff Size: Normal)   Pulse 79   Temp (!) 97.2 F (36.2 C) (Temporal)   Resp 16   Ht 5\' 6"  (1.676 m)   Wt 209 lb 6 oz (95 kg)   SpO2 99%   BMI 33.79 kg/m  General:   Well developed, NAD, BMI noted. HEENT:  Normocephalic . Face symmetric, atraumatic Lungs:  CTA B Normal respiratory effort, no intercostal retractions, no accessory muscle use. Heart: RRR,  no murmur.  Lower extremities: no pretibial edema bilaterally  Skin: Not pale. Not jaundice Neurologic:  alert & oriented X3.  Speech normal, gait appropriate for age and unassisted Psych--  Cognition and judgment appear intact.  Cooperative with normal attention span and concentration.  Behavior appropriate. No anxious or depressed appearing.      Assessment     Assessment A1C 5.8 Multiple sclerosis: neuro @ [facility].  DX 2003 Essential tremor HTN Pulmonary nodule, last CT 05-2015, stable, no further scans Gestational diabetes Proteinuria: Mild, chronic Contraception: Husband vasectomy  PLAN: COVID-19 pneumonia: Essentially feels 95% recuperated, no fever chills No chest pain no difficulty breathing.  Request chest x-ray.  Will do. HTN: Last BMP satisfactory,  BP slightly elevated today here, normal at home.  No change Diarrhea, dizziness: See last visit, resolved with before she was able to bring stool sample. RTC already scheduled for May 2021, CPE    This visit occurred during the SARS-CoV-2 public health emergency.  Safety protocols were in place, including screening questions prior to the visit, additional usage of staff PPE, and extensive cleaning of exam room while observing appropriate contact time as indicated for disinfecting solutions.

## 2019-04-15 NOTE — Assessment & Plan Note (Signed)
COVID-19 pneumonia: Essentially feels 95% recuperated, no fever chills No chest pain no difficulty breathing.  Request chest x-ray.  Will do. HTN: Last BMP satisfactory, BP slightly elevated today here, normal at home.  No change Diarrhea, dizziness: See last visit, resolved with before she was able to bring stool sample. RTC already scheduled for May 2021, CPE

## 2019-05-11 ENCOUNTER — Telehealth (INDEPENDENT_AMBULATORY_CARE_PROVIDER_SITE_OTHER): Payer: Managed Care, Other (non HMO) | Admitting: Internal Medicine

## 2019-05-11 ENCOUNTER — Encounter: Payer: Self-pay | Admitting: Internal Medicine

## 2019-05-11 VITALS — Ht 66.0 in | Wt 209.0 lb

## 2019-05-11 DIAGNOSIS — R197 Diarrhea, unspecified: Secondary | ICD-10-CM

## 2019-05-11 NOTE — Progress Notes (Signed)
Subjective:    Patient ID: Maria Graves, female    DOB: 30-Nov-1970, 49 y.o.   MRN: [id number]  DOS:  05/11/2019 Type of visit - description: Virtual Visit via Video Note  I connected with the above patient  by a video enabled telemedicine application and verified that I am speaking with the correct person using two identifiers.   THIS ENCOUNTER IS A VIRTUAL VISIT DUE TO COVID-19 - PATIENT WAS NOT SEEN IN THE OFFICE. PATIENT HAS CONSENTED TO VIRTUAL VISIT / TELEMEDICINE VISIT   Location of patient: home  Location of provider: office  I discussed the limitations of evaluation and management by telemedicine and the availability of in person appointments. The patient expressed understanding and agreed to proceed.  Acute Last week, her husband had nausea, vomiting and some diarrhea. He is better now.  Courtenay started with symptoms 3 days ago: Mild dyspepsia, heartburn but by the afternoon she has several episodes of nausea and vomiting. 2 days ago she felt okay with slightly loose stools but able to eat and drink. Yesterday at 11 PM she started suddenly with severe watery diarrhea, multiple BMs, no blood in the stools.  This morning, she feels exhausted. Denies fever chills Still has some abdominal cramping. Nausea has decreased. No recent ambulatory BPs   Review of Systems See above   Past Medical History:  Diagnosis Date  . Contraception    husband vasectomy  . Essential tremor   . Gestational diabetes   . HTN (hypertension)   . MS (multiple sclerosis) ([place])   . Proteinuria    Reports mild, chronic proteinuria    Past Surgical History:  Procedure Laterality Date  . TONSILLECTOMY  1991    Allergies as of 05/11/2019      Reactions   Erythromycin Base Nausea Only   Sulfa Antibiotics Hives   Bactrim caused Hives   Tetanus Toxoids Swelling   As a child   Macrobid [nitrofurantoin Macrocrystal] Other (See Comments)   Caused fever? See  OV 10/21/2018        Medication List       Accurate as of May 11, 2019 11:33 AM. If you have any questions, ask your nurse or doctor.        famotidine 10 MG tablet Commonly known as: PEPCID Take 10 mg by mouth daily.   fexofenadine 180 MG tablet Commonly known as: ALLEGRA Take 180 mg by mouth daily.   lisinopril 10 MG tablet Commonly known as: ZESTRIL Take 2 tablets (20 mg total) by mouth daily.   OCREVUS IV Inject into the vein.   Probiotic 1-250 BILLION-MG Caps   Vitamin D3 50 MCG (2000 UT) capsule Take 4,000 Units by mouth daily.          Objective:   Physical Exam Ht 5\' 6"  (1.676 m)   Wt 209 lb (94.8 kg)   BMI 33.73 kg/m  This is a virtual video visit, she looks tired but alert oriented x3 and in no distress    Assessment      Assessment A1C 5.8 Multiple sclerosis: neuro @ [facility].  DX 2003 Essential tremor HTN Pulmonary nodule, last CT 05-2015, stable, no further scans Gestational diabetes Proteinuria: Mild, chronic Contraception: Husband vasectomy  PLAN: Acute diarrhea: As described above, her husband was also affected by similar sxs, likely self limited infection or food related.  At this point I recommend the following: Good hydration, OTC Mylanta, Imodium or Kaopectate.  Declined antinausea medication for now. Hold lisinopril to  2 days to be sure. Call in few days if she is not better.   I discussed the assessment and treatment plan with the patient. The patient was provided an opportunity to ask questions and all were answered. The patient agreed with the plan and demonstrated an understanding of the instructions.   The patient was advised to call back or seek an in-person evaluation if the symptoms worsen or if the condition fails to improve as anticipated.

## 2019-05-11 NOTE — Progress Notes (Signed)
Pre visit review using our clinic review tool, if applicable. No additional management support is needed unless otherwise documented below in the visit note. 

## 2019-05-12 NOTE — Assessment & Plan Note (Signed)
Acute diarrhea: As described above, her husband was also affected by similar sxs, likely self limited infection or food related.  At this point I recommend the following: Good hydration, OTC Mylanta, Imodium or Kaopectate.  Declined antinausea medication for now. Hold lisinopril to 2 days to be sure. Call in few days if she is not better.

## 2019-06-16 ENCOUNTER — Encounter: Payer: Self-pay | Admitting: Internal Medicine

## 2019-06-18 ENCOUNTER — Other Ambulatory Visit: Payer: Self-pay

## 2019-06-18 ENCOUNTER — Encounter (HOSPITAL_BASED_OUTPATIENT_CLINIC_OR_DEPARTMENT_OTHER): Payer: Self-pay

## 2019-06-18 ENCOUNTER — Emergency Department (HOSPITAL_BASED_OUTPATIENT_CLINIC_OR_DEPARTMENT_OTHER)
Admission: EM | Admit: 2019-06-18 | Discharge: 2019-06-18 | Disposition: A | Discharge status: Home / Self Care | Payer: Managed Care, Other (non HMO) | Attending: Emergency Medicine | Admitting: Emergency Medicine

## 2019-06-18 DIAGNOSIS — I10 Essential (primary) hypertension: Secondary | ICD-10-CM | POA: Insufficient documentation

## 2019-06-18 DIAGNOSIS — Z79899 Other long term (current) drug therapy: Secondary | ICD-10-CM | POA: Insufficient documentation

## 2019-06-18 DIAGNOSIS — Z87891 Personal history of nicotine dependence: Secondary | ICD-10-CM | POA: Insufficient documentation

## 2019-06-18 DIAGNOSIS — N3090 Cystitis, unspecified without hematuria: Secondary | ICD-10-CM | POA: Insufficient documentation

## 2019-06-18 DIAGNOSIS — R109 Unspecified abdominal pain: Secondary | ICD-10-CM | POA: Diagnosis present

## 2019-06-18 LAB — URINALYSIS, MICROSCOPIC (REFLEX): WBC, UA: >50 WBC/hpf (ref 0–5)

## 2019-06-18 LAB — CBC
HCT: 45.9 % (ref 36.0–46.0)
Hemoglobin: 15.2 g/dL — ABNORMAL HIGH (ref 12.0–15.0)
MCH: 28.1 pg (ref 26.0–34.0)
MCHC: 33.1 g/dL (ref 30.0–36.0)
MCV: 85 fL (ref 80.0–100.0)
Platelets: 324 10*3/uL (ref 150–400)
RBC: 5.4 MIL/uL — ABNORMAL HIGH (ref 3.87–5.11)
RDW: 13.2 % (ref 11.5–15.5)
WBC: 15.8 10*3/uL — ABNORMAL HIGH (ref 4.0–10.5)
nRBC: 0 % (ref 0.0–0.2)

## 2019-06-18 LAB — URINALYSIS, ROUTINE W REFLEX MICROSCOPIC
Bilirubin Urine: NEGATIVE
Glucose, UA: NEGATIVE mg/dL
Ketones, ur: NEGATIVE mg/dL
Nitrite: NEGATIVE
Protein, ur: NEGATIVE mg/dL
Specific Gravity, Urine: 1.025 (ref 1.005–1.030)
pH: 5.5 (ref 5.0–8.0)

## 2019-06-18 LAB — COMPREHENSIVE METABOLIC PANEL
ALT: 20 U/L (ref 0–44)
AST: 14 U/L — ABNORMAL LOW (ref 15–41)
Albumin: 4.6 g/dL (ref 3.5–5.0)
Alkaline Phosphatase: 64 U/L (ref 38–126)
Anion gap: 10 (ref 5–15)
BUN: 14 mg/dL (ref 6–20)
CO2: 26 mmol/L (ref 22–32)
Calcium: 9 mg/dL (ref 8.9–10.3)
Chloride: 100 mmol/L (ref 98–111)
Creatinine, Ser: 0.79 mg/dL (ref 0.44–1.00)
GFR calc Af Amer: >60 mL/min (ref >60)
GFR calc non Af Amer: >60 mL/min (ref >60)
Glucose, Bld: 107 mg/dL — ABNORMAL HIGH (ref 70–99)
Potassium: 3.5 mmol/L (ref 3.5–5.1)
Sodium: 136 mmol/L (ref 135–145)
Total Bilirubin: 0.6 mg/dL (ref 0.3–1.2)
Total Protein: 7.7 g/dL (ref 6.5–8.1)

## 2019-06-18 LAB — PREGNANCY, URINE: Preg Test, Ur: NEGATIVE

## 2019-06-18 LAB — LIPASE, BLOOD: Lipase: 22 U/L (ref 11–51)

## 2019-06-18 MED ORDER — SODIUM CHLORIDE 0.9% FLUSH
3.0000 mL | Freq: Once | INTRAVENOUS | Status: DC
  Filled 2019-06-18: qty 3

## 2019-06-18 MED ORDER — CEPHALEXIN 500 MG PO CAPS: 500.0000 mg | ORAL_CAPSULE | Freq: Two times a day (BID) | ORAL | 0 refills | Status: AC

## 2019-06-18 NOTE — ED Provider Notes (Signed)
[facility] EMERGENCY DEPARTMENT Provider Note   CSN: [id number] Arrival date & time: 06/18/19  1220     History Chief Complaint  Patient presents with  . Abdominal Pain    Maria Graves is a 49 y.o. female.  HPI      1 wk of symptoms, urinary frequency, discomfort, but had periods of close to normal urination Last night every 3-5 minutes and every 10  Minutes had to urinate, also having frequent bowel movements, was pale.  BM stopped around 10AM. Urinary frequency continued.   Similar 8-10 years ago, distended feeling like need to urinate, and one month later found to have a kidney infection  Trying to find comforable way to sit so doesn't feel she needs to go but no flank pain 3/10 fullness of bladder sensation lower  No feveres, no nausea/vomiting, burping/gas Feels like need to urinate like a fluttering/itching Hx of MS No vaginal discharge or itching  Past Medical History:  Diagnosis Date  . Contraception    husband vasectomy  . Essential tremor   . Gestational diabetes   . HTN (hypertension)   . MS (multiple sclerosis) (HCC)   . Proteinuria    Reports mild, chronic proteinuria    Patient Active Problem List   Diagnosis Date Noted  . PCP NOTES >>> 11/16/2014  . Right knee injury 06/25/2014  . Multiple sclerosis (HCC) 10/01/2012  . Annual physical exam 10/01/2012  . Solitary pulmonary nodule 10/01/2012  . HTN (hypertension) 10/01/2012    Past Surgical History:  Procedure Laterality Date  . TONSILLECTOMY  1991     OB History    Gravida  3   Para  2   Term      Preterm      AB  1   Living  2     SAB      TAB      Ectopic      Multiple      Live Births              Family History  Problem Relation Age of Onset  . Arthritis Mother   . Alcohol abuse Mother   . Obesity Mother   . COPD Mother   . Depression Mother   . Diabetes Mother   . Hyperlipidemia Mother   . Cardiomyopathy Mother   . Atrial fibrillation  Mother   . Hypertension Brother   . Heart disease Paternal Grandfather   . Heart attack Paternal Grandfather   . Stroke Paternal Grandmother   . Diabetes Father   . Clotting disorder Father   . Kidney disease Father   . Obesity Father   . Heart disease Father   . Hyperlipidemia Father   . Hypertension Father   . Atrial fibrillation Father   . Diabetes Maternal Grandfather   . Sudden death Other        uncle, GF  . Colon cancer Neg Hx   . Breast cancer Neg Hx     Social History   Tobacco Use  . Smoking status: Former Smoker    Quit date: 02/11/2017    Years since quitting: 2.3  . Smokeless tobacco: Never Used  . Tobacco comment: vaping as off 05-2017  Substance Use Topics  . Alcohol use: Yes    Alcohol/week: 0.0 standard drinks    Comment: Socially  . Drug use: No    Home Medications Prior to Admission medications   Medication Sig Start Date End Date Taking? Authorizing  Provider  Bacillus Coagulans-Inulin (PROBIOTIC) 1-250 BILLION-MG CAPS  01/29/14   [provider]  cephALEXin (KEFLEX) 500 MG capsule Take 1 capsule (500 mg total) by mouth 2 (two) times daily for 7 days. 06/18/19 06/25/19  Alvira Monday, MD  Cholecalciferol (VITAMIN D3) 2000 units capsule Take 4,000 Units by mouth daily.    [provider]  famotidine (PEPCID) 10 MG tablet Take 10 mg by mouth daily.    [provider]  fexofenadine (ALLEGRA) 180 MG tablet Take 180 mg by mouth daily.    [provider]  lisinopril (ZESTRIL) 10 MG tablet Take 2 tablets (20 mg total) by mouth daily. 03/09/19   Wanda Plump, MD  Ocrelizumab (OCREVUS IV) Inject into the vein.    [provider]    Allergies    Erythromycin base, Sulfa antibiotics, Tetanus toxoids, and Macrobid [nitrofurantoin macrocrystal]  Review of Systems   Review of Systems  Constitutional: Negative for fever.  HENT: Negative for sore throat.   Eyes: Negative for visual disturbance.  Respiratory: Negative for  cough and shortness of breath.   Cardiovascular: Negative for chest pain.  Gastrointestinal: Positive for abdominal pain. Negative for diarrhea (loose stool earlier), nausea and vomiting.  Genitourinary: Positive for difficulty urinating and frequency. Negative for flank pain, vaginal bleeding and vaginal discharge.  Musculoskeletal: Negative for back pain and neck pain.  Skin: Negative for rash.  Neurological: Negative for syncope and headaches.    Physical Exam Updated Vital Signs BP 138/81 (BP Location: Right Arm)   Pulse 72   Temp 98.2 F (36.8 C) (Oral)   Resp 19   Ht 5\' 7"  (1.702 m)   Wt 94.3 kg   SpO2 98%   BMI 32.58 kg/m   Physical Exam  ED Results / Procedures / Treatments   Labs (all labs ordered are listed, but only abnormal results are displayed) Labs Reviewed  COMPREHENSIVE METABOLIC PANEL - Abnormal; Notable for the following components:      Result Value   Glucose, Bld 107 (*)    AST 14 (*)    All other components within normal limits  CBC - Abnormal; Notable for the following components:   WBC 15.8 (*)    RBC 5.40 (*)    Hemoglobin 15.2 (*)    All other components within normal limits  URINALYSIS, ROUTINE W REFLEX MICROSCOPIC - Abnormal; Notable for the following components:   APPearance CLOUDY (*)    Hgb urine dipstick LARGE (*)    Leukocytes,Ua TRACE (*)    All other components within normal limits  URINALYSIS, MICROSCOPIC (REFLEX) - Abnormal; Notable for the following components:   Bacteria, UA FEW (*)    All other components within normal limits  LIPASE, BLOOD  PREGNANCY, URINE    EKG None  Radiology No results found.  Procedures Procedures (including critical care time)  Medications Ordered in ED Medications - No data to display  ED Course  I have reviewed the triage vital signs and the nursing notes.  Pertinent labs & imaging results that were available during my care of the patient were reviewed by me and considered in my medical  decision making (see chart for details).    MDM Rules/Calculators/A&P                      49yo female with history above including MS presents with concern for urinary frequency.  Labs show no pancreatitis, hepatitis.  Mild leukocytosis.  UA shows greater than 50  WBC, WBC clumps present.  Symptoms and UA consistent with UTI.  No sign of pyelonephritis. Patient discharged in stable condition with understanding of reasons to return.   Final Clinical Impression(s) / ED Diagnoses Final diagnoses:  Cystitis    Rx / DC Orders ED Discharge Orders         Ordered    cephALEXin (KEFLEX) 500 MG capsule  2 times daily     06/18/19 1437           Gareth Morgan, MD 06/18/19 2207

## 2019-06-18 NOTE — ED Notes (Addendum)
Patient stated that she felt that she is not emptying her bladder.  Bladder scan done and repeated x 3 with negative result.  Patient also stated that it is "tingling and itching inside her cervix".   Denies any vaginal discharges.

## 2019-06-18 NOTE — ED Triage Notes (Addendum)
Pt c/o lower abd pain-urinary retention and pale freq soft BM-sx started x 1 week-NAD-steady gait

## 2019-06-19 ENCOUNTER — Ambulatory Visit: Payer: Managed Care, Other (non HMO) | Admitting: Internal Medicine

## 2019-07-09 ENCOUNTER — Other Ambulatory Visit: Payer: Self-pay | Admitting: Obstetrics and Gynecology

## 2019-07-09 DIAGNOSIS — N6002 Solitary cyst of left breast: Secondary | ICD-10-CM

## 2019-07-21 ENCOUNTER — Other Ambulatory Visit: Payer: Self-pay | Admitting: Internal Medicine

## 2019-07-21 DIAGNOSIS — N6002 Solitary cyst of left breast: Secondary | ICD-10-CM

## 2019-07-23 ENCOUNTER — Ambulatory Visit
Admission: RE | Admit: 2019-07-23 | Discharge: 2019-07-23 | Disposition: A | Discharge status: Home / Self Care | Payer: Managed Care, Other (non HMO) | Source: Ambulatory Visit | Attending: Obstetrics and Gynecology | Admitting: Obstetrics and Gynecology

## 2019-07-23 ENCOUNTER — Other Ambulatory Visit: Payer: Self-pay | Admitting: Internal Medicine

## 2019-07-23 ENCOUNTER — Ambulatory Visit
Admission: RE | Admit: 2019-07-23 | Discharge: 2019-07-23 | Disposition: A | Discharge status: Home / Self Care | Payer: Managed Care, Other (non HMO) | Source: Ambulatory Visit | Attending: Internal Medicine | Admitting: Internal Medicine

## 2019-07-23 ENCOUNTER — Other Ambulatory Visit: Payer: Self-pay

## 2019-07-23 DIAGNOSIS — N6310 Unspecified lump in the right breast, unspecified quadrant: Secondary | ICD-10-CM

## 2019-07-23 DIAGNOSIS — N6002 Solitary cyst of left breast: Secondary | ICD-10-CM

## 2019-10-09 ENCOUNTER — Other Ambulatory Visit: Payer: Self-pay | Admitting: Internal Medicine

## 2019-11-25 ENCOUNTER — Telehealth: Payer: Self-pay | Admitting: Internal Medicine

## 2019-11-25 ENCOUNTER — Encounter: Payer: Self-pay | Admitting: Internal Medicine

## 2019-11-25 ENCOUNTER — Other Ambulatory Visit: Payer: Self-pay | Admitting: Internal Medicine

## 2019-11-25 MED ORDER — LISINOPRIL 10 MG PO TABS
20.0000 mg | ORAL_TABLET | Freq: Every day | ORAL | 0 refills | Status: DC
Start: 2019-11-25 — End: 2019-12-07

## 2019-11-25 NOTE — Telephone Encounter (Signed)
Her last physical was 05/2020, she has seen PCP several times this year for acute visits only.

## 2019-11-25 NOTE — Telephone Encounter (Signed)
Patient states she is supposed to see you only once a year. She is not sure why you requesting to see her?  Need refill on  lisinopril (ZESTRIL) 10 MG tablet [[id number]]

## 2019-12-07 ENCOUNTER — Other Ambulatory Visit: Payer: Self-pay

## 2019-12-07 ENCOUNTER — Ambulatory Visit (INDEPENDENT_AMBULATORY_CARE_PROVIDER_SITE_OTHER): Payer: Managed Care, Other (non HMO) | Admitting: Internal Medicine

## 2019-12-07 ENCOUNTER — Telehealth: Payer: Self-pay

## 2019-12-07 ENCOUNTER — Encounter: Payer: Self-pay | Admitting: Internal Medicine

## 2019-12-07 VITALS — BP 131/82 | HR 80 | Temp 98.0°F | Resp 16 | Ht 66.0 in | Wt 198.2 lb

## 2019-12-07 DIAGNOSIS — Z0000 Encounter for general adult medical examination without abnormal findings: Secondary | ICD-10-CM

## 2019-12-07 DIAGNOSIS — I10 Essential (primary) hypertension: Secondary | ICD-10-CM

## 2019-12-07 DIAGNOSIS — R739 Hyperglycemia, unspecified: Secondary | ICD-10-CM

## 2019-12-07 LAB — CBC WITH DIFFERENTIAL/PLATELET
Basophils Absolute: 0.1 10*3/uL (ref 0.0–0.1)
Basophils Relative: 1 % (ref 0.0–3.0)
Eosinophils Absolute: 0.1 10*3/uL (ref 0.0–0.7)
Eosinophils Relative: 0.9 % (ref 0.0–5.0)
HCT: 45.1 % (ref 36.0–46.0)
Hemoglobin: 15 g/dL (ref 12.0–15.0)
Lymphocytes Relative: 19.5 % (ref 12.0–46.0)
Lymphs Abs: 2.3 10*3/uL (ref 0.7–4.0)
MCHC: 33.2 g/dL (ref 30.0–36.0)
MCV: 86 fl (ref 78.0–100.0)
Monocytes Absolute: 0.6 10*3/uL (ref 0.1–1.0)
Monocytes Relative: 5.4 % (ref 3.0–12.0)
Neutro Abs: 8.5 10*3/uL — ABNORMAL HIGH (ref 1.4–7.7)
Neutrophils Relative %: 73.2 % (ref 43.0–77.0)
Platelets: 312 10*3/uL (ref 150.0–400.0)
RBC: 5.25 Mil/uL — ABNORMAL HIGH (ref 3.87–5.11)
RDW: 13.8 % (ref 11.5–15.5)
WBC: 11.6 10*3/uL — ABNORMAL HIGH (ref 4.0–10.5)

## 2019-12-07 LAB — COMPREHENSIVE METABOLIC PANEL
ALT: 19 U/L (ref 0–35)
AST: 12 U/L (ref 0–37)
Albumin: 4.5 g/dL (ref 3.5–5.2)
Alkaline Phosphatase: 65 U/L (ref 39–117)
BUN: 11 mg/dL (ref 6–23)
CO2: 29 mEq/L (ref 19–32)
Calcium: 9.6 mg/dL (ref 8.4–10.5)
Chloride: 100 mEq/L (ref 96–112)
Creatinine, Ser: 0.77 mg/dL (ref 0.40–1.20)
GFR: 89.14 mL/min (ref >60.00)
Glucose, Bld: 82 mg/dL (ref 70–99)
Potassium: 4.5 mEq/L (ref 3.5–5.1)
Sodium: 137 mEq/L (ref 135–145)
Total Bilirubin: 0.4 mg/dL (ref 0.2–1.2)
Total Protein: 6.8 g/dL (ref 6.0–8.3)

## 2019-12-07 LAB — LIPID PANEL
Cholesterol: 195 mg/dL (ref 0–200)
HDL: 51.7 mg/dL (ref >39.00)
LDL Cholesterol: 121 mg/dL — ABNORMAL HIGH (ref 0–99)
NonHDL: 143.58
Total CHOL/HDL Ratio: 4
Triglycerides: 115 mg/dL (ref 0.0–149.0)
VLDL: 23 mg/dL (ref 0.0–40.0)

## 2019-12-07 LAB — TSH: TSH: 0.87 u[IU]/mL (ref 0.35–4.50)

## 2019-12-07 LAB — HEMOGLOBIN A1C: Hgb A1c MFr Bld: 5.9 % (ref 4.6–6.5)

## 2019-12-07 MED ORDER — LISINOPRIL 10 MG PO TABS: 20.0000 mg | ORAL_TABLET | Freq: Every day | ORAL | 3 refills | Status: DC

## 2019-12-07 NOTE — Assessment & Plan Note (Signed)
--  Td: intolerant, ++ swelling  - s/p covid vax x 2, rec booster by 04-2019 - declines flu shot --Female care per Gyn.   Also saw urology, DX: Irritative lower urinary symptoms, genitourinary syndrome of menopause, Rx topical estrogen, patient has not tried it yet. --Never had a cscope,  last year, I fob negative, 3 options discussed, elected cologuard --Labs: CMP, FLP, CBC, A1c, TSH --Tobacco: quit vaping and now is smoking some.  Encouraged to quit, she is considering nicotine gum  --lifestyle: Counseled.

## 2019-12-07 NOTE — Progress Notes (Signed)
Subjective:    Patient ID: Maria Graves, female    DOB: October 20, 1970, 49 y.o.   MRN: [id number]  DOS:  12/07/2019 Type of visit - description: CPX  Had a number of GI and GU symptoms, see last visit, all resolved. Saw gynecology and urology.    Review of Systems   A 14 point review of systems is negative    Past Medical History:  Diagnosis Date  . Contraception    husband vasectomy  . Essential tremor   . Gestational diabetes   . HTN (hypertension)   . MS (multiple sclerosis) (HCC)   . Proteinuria    Reports mild, chronic proteinuria    Past Surgical History:  Procedure Laterality Date  . TONSILLECTOMY  1991    Allergies as of 12/07/2019      Reactions   Phenazopyridine Rash   Erythromycin Base Nausea Only   Sulfa Antibiotics Hives   Bactrim caused Hives   Tetanus Toxoids Swelling   As a child   Macrobid [nitrofurantoin Macrocrystal] Other (See Comments)   Caused fever? See  OV 10/21/2018      Medication List       Accurate as of December 07, 2019  9:36 PM. If you have any questions, ask your nurse or doctor.        famotidine 10 MG tablet Commonly known as: PEPCID Take 10 mg by mouth daily.   fexofenadine 180 MG tablet Commonly known as: ALLEGRA Take 180 mg by mouth daily.   lisinopril 10 MG tablet Commonly known as: ZESTRIL Take 2 tablets (20 mg total) by mouth daily.   OCREVUS IV Inject into the vein.   Probiotic 1-250 BILLION-MG Caps   Vitamin D3 50 MCG (2000 UT) capsule Take 4,000 Units by mouth daily.          Objective:   Physical Exam BP 131/82 (BP Location: Left Arm, Patient Position: Sitting, Cuff Size: Small)   Pulse 80   Temp 98 F (36.7 C) (Oral)   Resp 16   Ht 5\' 6"  (1.676 m)   Wt 198 lb 4 oz (89.9 kg)   SpO2 99%   BMI 32.00 kg/m  General: Well developed, NAD, BMI noted Neck: No  thyromegaly  HEENT:  Normocephalic . Face symmetric, atraumatic Lungs:  CTA B Normal respiratory effort, no intercostal  retractions, no accessory muscle use. Heart: RRR,  no murmur.  Abdomen:  Not distended, soft, non-tender. No rebound or rigidity.   Lower extremities: no pretibial edema bilaterally  Skin: Exposed areas without rash. Not pale. Not jaundice Neurologic:  alert & oriented X3.  Speech normal, gait appropriate for age and unassisted Strength symmetric and appropriate for age.  Psych: Cognition and judgment appear intact.  Cooperative with normal attention span and concentration.  Behavior appropriate. No anxious or depressed appearing.     Assessment      Assessment A1C 5.8 Multiple sclerosis: neuro @ [facility].  DX 2003 Essential tremor HTN Pulmonary nodule, last CT 05-2015, stable, no further scans Gestational diabetes Proteinuria: Mild, chronic Contraception: Husband vasectomy  PLAN: Here for CPX Mild hyperglycemia: Check A1c Multiple sclerosis: Last visit with neurology 07-2019 HTN: On lisinopril, BP today very good, checking labs COVID-19 pneumonia, diarrhea: See previous visits, all symptoms resolved, she is feeling well Stress: Does have some stress, management discussed. RTC CPX 1 year (would like to reschedule CPX in May 2022 to go back on her normal schedule, I agree)   This visit occurred during the SARS-CoV-2  public health emergency.  Safety protocols were in place, including screening questions prior to the visit, additional usage of staff PPE, and extensive cleaning of exam room while observing appropriate contact time as indicated for disinfecting solutions.

## 2019-12-07 NOTE — Telephone Encounter (Signed)
Cologuard ordered through [employer] online portal.  

## 2019-12-07 NOTE — Progress Notes (Signed)
Pre visit review using our clinic review tool, if applicable. No additional management support is needed unless otherwise documented below in the visit note. 

## 2019-12-07 NOTE — Assessment & Plan Note (Signed)
Here for CPX Mild hyperglycemia: Check A1c Multiple sclerosis: Last visit with neurology 07-2019 HTN: On lisinopril, BP today very good, checking labs COVID-19 pneumonia, diarrhea: See previous visits, all symptoms resolved, she is feeling well Stress: Does have some stress, management discussed. RTC CPX 1 year (would like to reschedule CPX in May 2022 to go back on her normal schedule, I agree)

## 2019-12-07 NOTE — Patient Instructions (Signed)
Check the  blood pressure twice a month. °BP GOAL is between 110/65 and  135/85. °If it is consistently higher or lower, let me know ° ° °GO TO THE LAB : Get the blood work   ° ° °GO TO THE FRONT DESK, PLEASE SCHEDULE YOUR APPOINTMENTS °Come back for a physical exam in 1 year °

## 2019-12-09 ENCOUNTER — Telehealth: Payer: Self-pay | Admitting: Internal Medicine

## 2019-12-09 NOTE — Telephone Encounter (Signed)
Patient states she called her insurance company and they will cover for her to get cologuard. Patient would like to know what is the next step?

## 2019-12-09 NOTE — Telephone Encounter (Signed)
Order was already placed. She should be getting the box in the mail soon.

## 2020-01-06 LAB — COLOGUARD: Cologuard: NEGATIVE

## 2020-01-18 ENCOUNTER — Encounter: Payer: Self-pay | Admitting: Internal Medicine

## 2020-04-26 ENCOUNTER — Encounter: Payer: Self-pay | Admitting: Internal Medicine

## 2020-05-09 ENCOUNTER — Encounter: Payer: Self-pay | Admitting: Internal Medicine

## 2020-07-06 LAB — HM PAP SMEAR

## 2020-07-27 DIAGNOSIS — R87610 Atypical squamous cells of undetermined significance on cytologic smear of cervix (ASC-US): Secondary | ICD-10-CM | POA: Insufficient documentation

## 2020-07-28 IMAGING — DX DG CHEST 2V
2 series · 2 of 2 positions shown · non-contrast
Comparison: Chest x-ray dated 04/16/2015

CLINICAL DATA: Follow-up of SASQ2-8T diagnosed on 03/13/2019

EXAM:
CHEST - 2 VIEW

[chest pa]
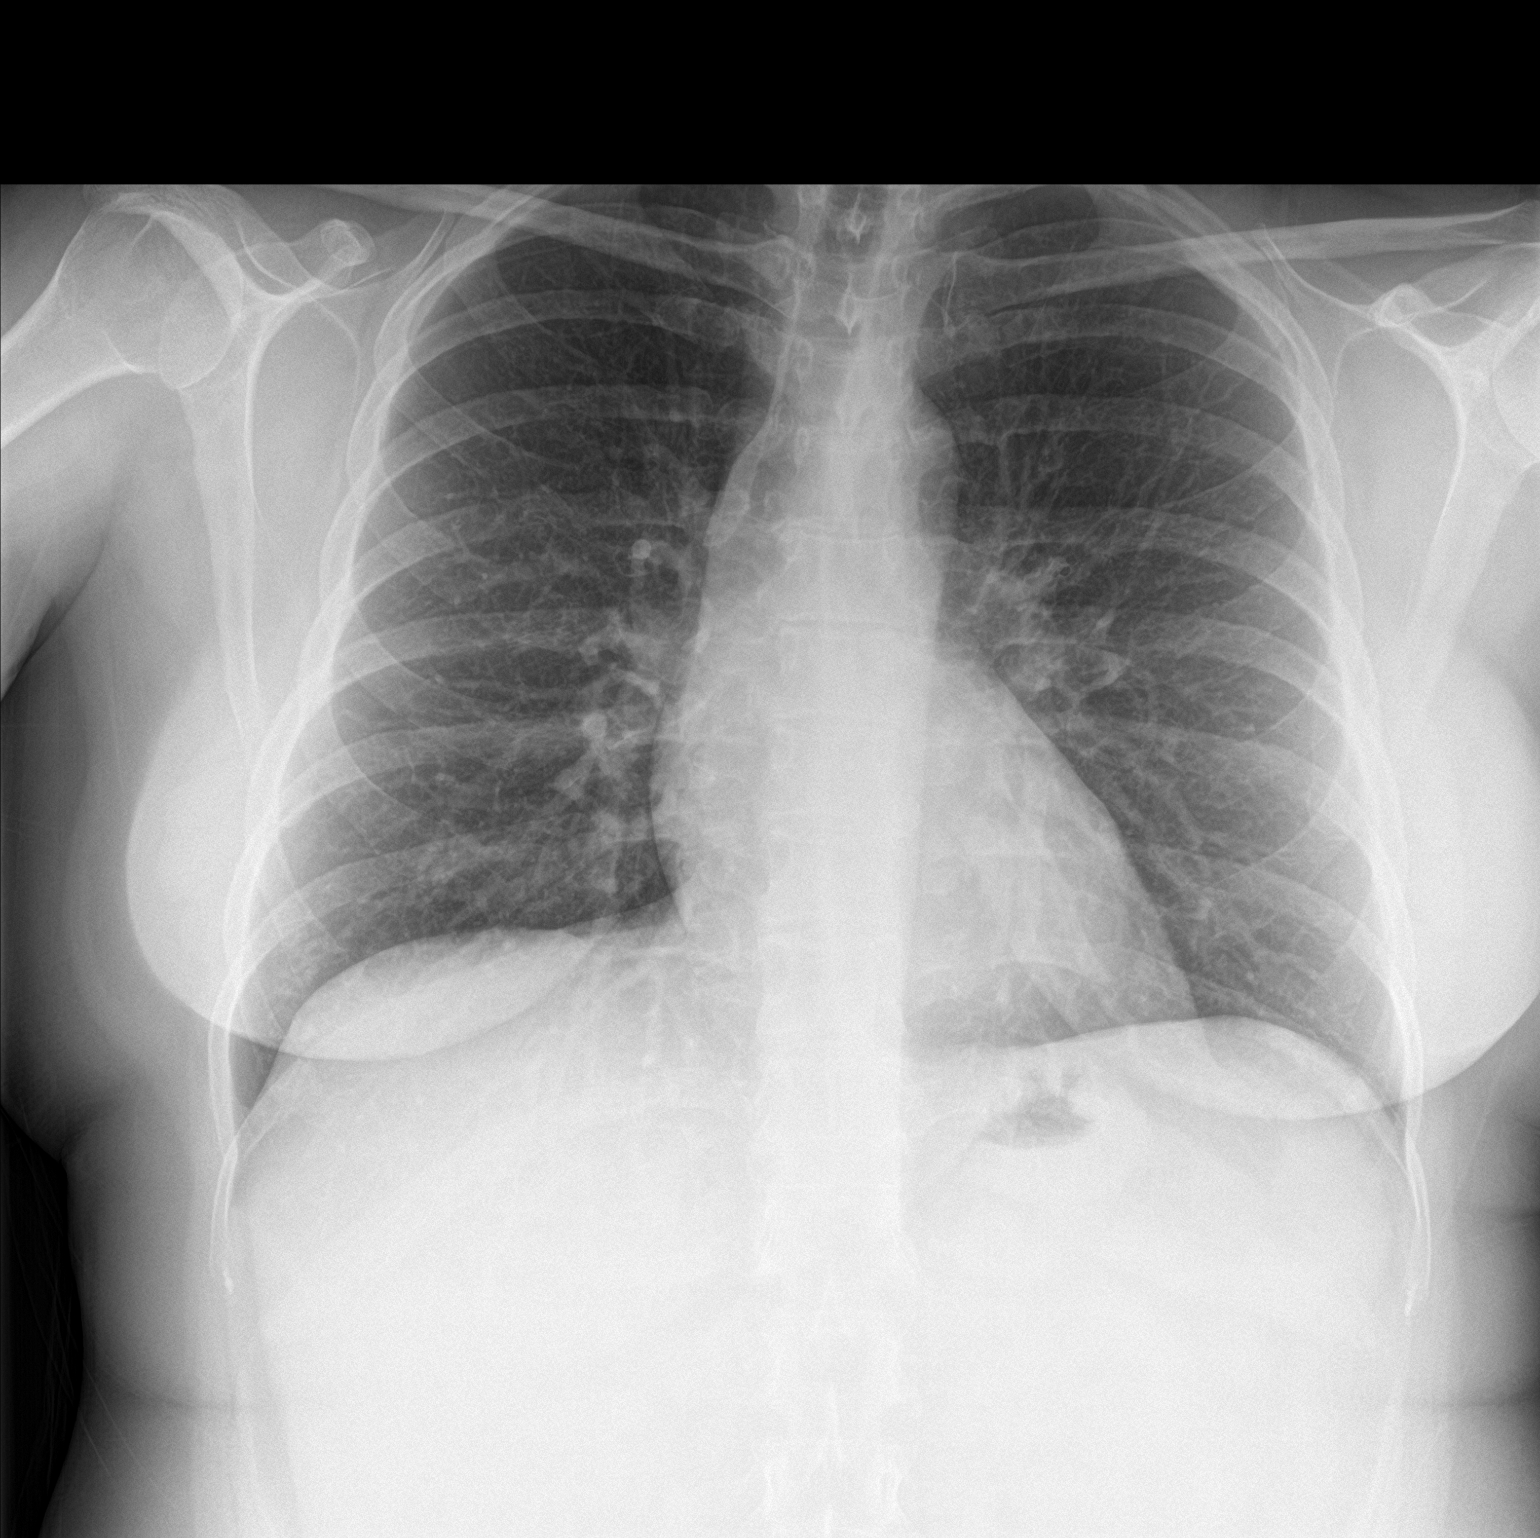

[chest lat]
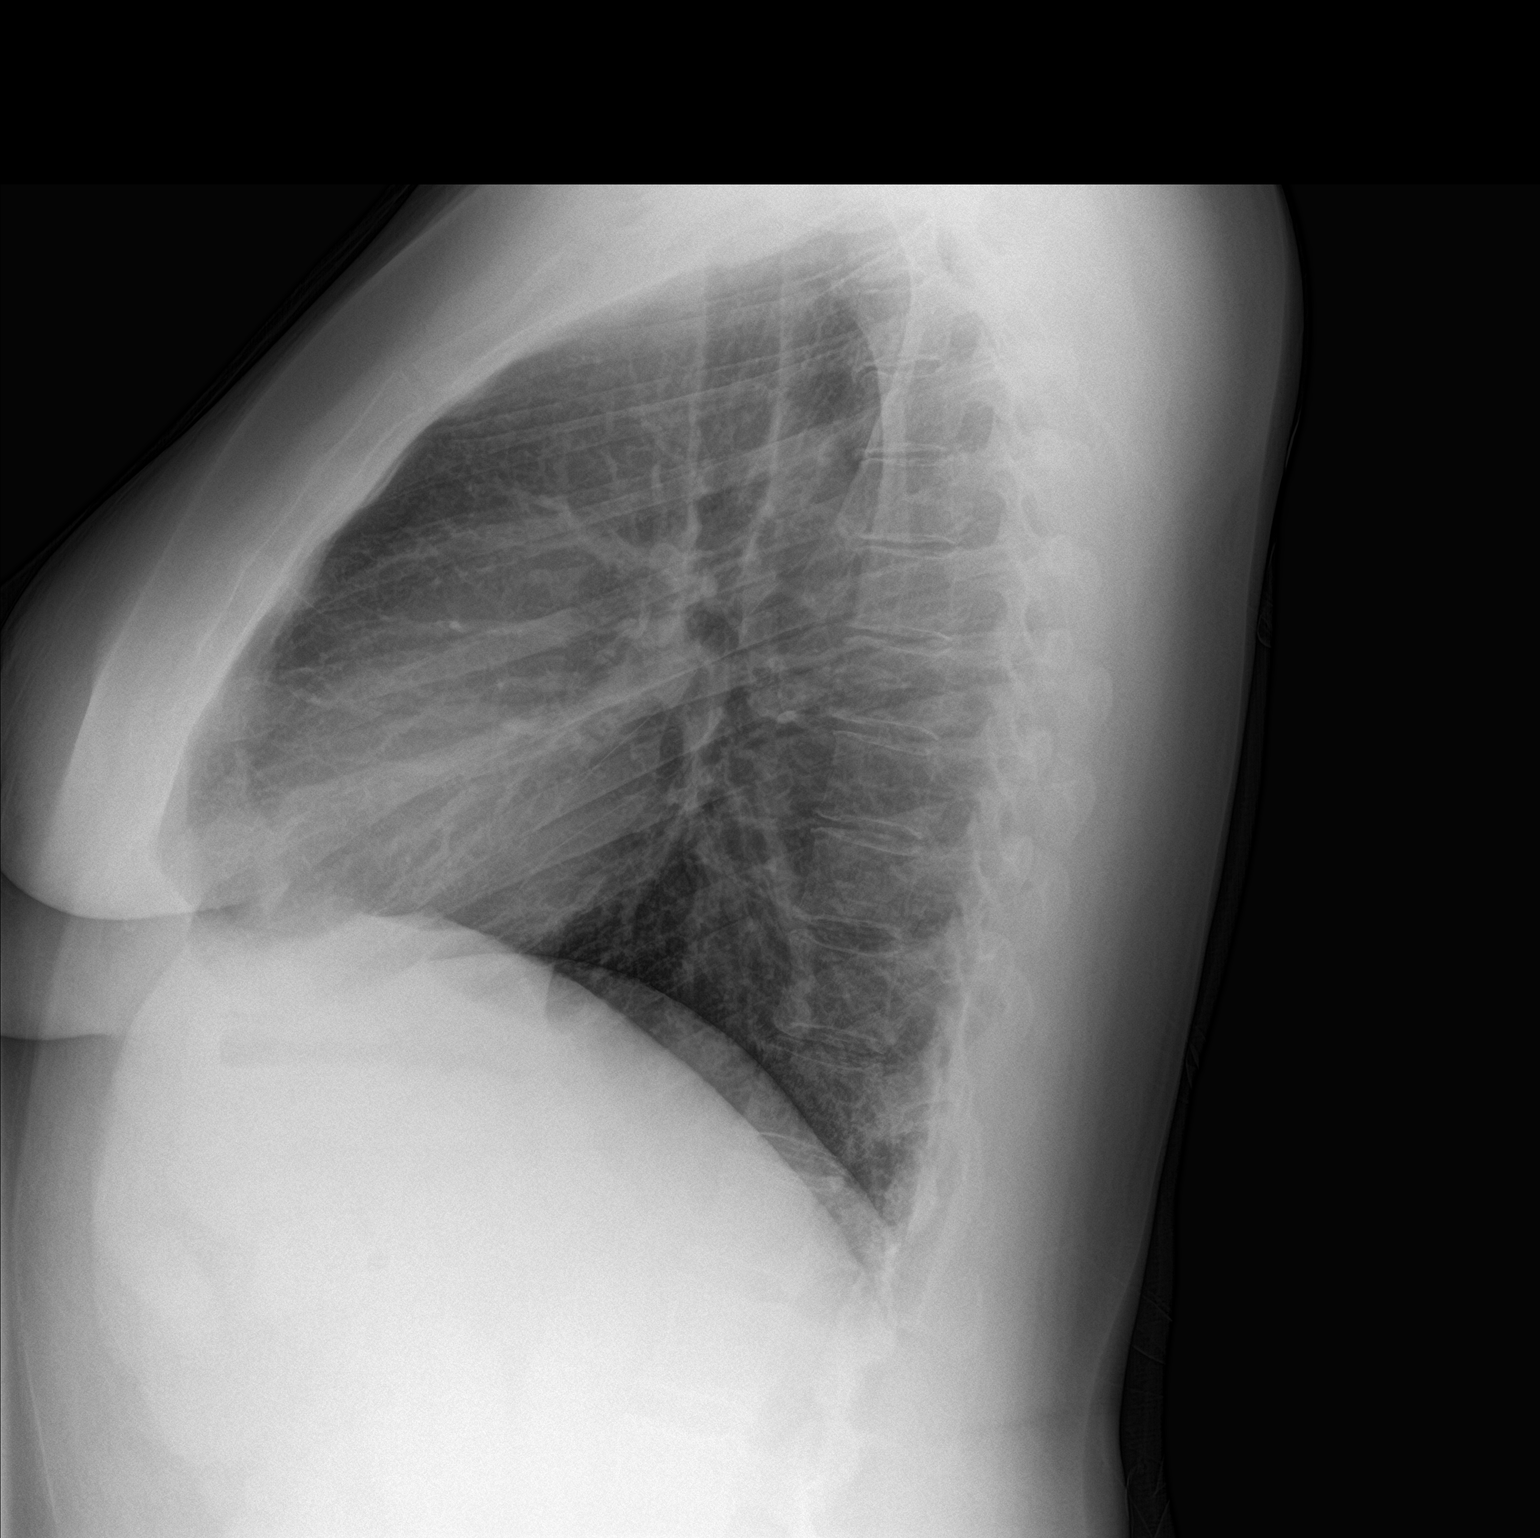

[2 of 2 positions shown; findings below may reference images not displayed]

FINDINGS: The heart size and mediastinal contours are within normal limits.
Both lungs are clear. The visualized skeletal structures are
unremarkable.
IMPRESSION: Normal exam.

## 2020-09-05 ENCOUNTER — Other Ambulatory Visit: Payer: Self-pay | Admitting: Internal Medicine

## 2020-09-05 DIAGNOSIS — Z1231 Encounter for screening mammogram for malignant neoplasm of breast: Secondary | ICD-10-CM

## 2020-09-15 ENCOUNTER — Ambulatory Visit
Admission: RE | Admit: 2020-09-15 | Discharge: 2020-09-15 | Disposition: A | Discharge status: Home / Self Care | Payer: Managed Care, Other (non HMO) | Source: Ambulatory Visit | Attending: Internal Medicine | Admitting: Internal Medicine

## 2020-09-15 ENCOUNTER — Other Ambulatory Visit: Payer: Self-pay

## 2020-09-15 DIAGNOSIS — Z1231 Encounter for screening mammogram for malignant neoplasm of breast: Secondary | ICD-10-CM

## 2020-12-05 ENCOUNTER — Other Ambulatory Visit: Payer: Self-pay

## 2020-12-05 ENCOUNTER — Encounter: Payer: Self-pay | Admitting: Internal Medicine

## 2020-12-05 ENCOUNTER — Ambulatory Visit (INDEPENDENT_AMBULATORY_CARE_PROVIDER_SITE_OTHER): Payer: Managed Care, Other (non HMO) | Admitting: Internal Medicine

## 2020-12-05 VITALS — BP 126/82 | HR 68 | Temp 98.4°F | Resp 16 | Ht 66.0 in | Wt 186.5 lb

## 2020-12-05 DIAGNOSIS — F439 Reaction to severe stress, unspecified: Secondary | ICD-10-CM

## 2020-12-05 DIAGNOSIS — M25512 Pain in left shoulder: Secondary | ICD-10-CM

## 2020-12-05 NOTE — Progress Notes (Signed)
Subjective:    Patient ID: Maria Graves, female    DOB: 1970/09/07, 50 y.o.   MRN: [id number]  DOS:  12/05/2020 Type of visit - description: acute  In the last 3 weeks, she did some redecorating at her house: Painting, wallpapering. Subsequently she developed pain at the left shoulder acutely. Range of motion was decreased due to pain. Mild neck pain.  She took ibuprofen, it has definitely helped, it caused some stomach discomfort though.  Review of Systems See above   Past Medical History:  Diagnosis Date   Contraception    husband vasectomy   Essential tremor    Gestational diabetes    HTN (hypertension)    MS (multiple sclerosis) (HCC)    Proteinuria    Reports mild, chronic proteinuria    Past Surgical History:  Procedure Laterality Date   TONSILLECTOMY  1991    Allergies as of 12/05/2020       Reactions   Phenazopyridine Rash   Erythromycin Base Nausea Only   Sulfa Antibiotics Hives   Bactrim caused Hives   Tetanus Toxoids Swelling   As a child   Macrobid [nitrofurantoin Macrocrystal] Other (See Comments)   Caused fever? See  OV 10/21/2018        Medication List        Accurate as of December 05, 2020  1:09 PM. If you have any questions, ask your nurse or doctor.          famotidine 10 MG tablet Commonly known as: PEPCID Take 10 mg by mouth daily.   lisinopril 10 MG tablet Commonly known as: ZESTRIL Take 2 tablets (20 mg total) by mouth daily.   OCREVUS IV Inject into the vein.   Probiotic 1-250 BILLION-MG Caps   TURMERIC PO Take by mouth.   Vitamin D3 50 MCG (2000 UT) capsule Take 4,000 Units by mouth daily.           Objective:   Physical Exam BP 126/82 (BP Location: Right Arm, Patient Position: Sitting, Cuff Size: Small)   Pulse 68   Temp 98.4 F (36.9 C) (Oral)   Resp 16   Ht 5\' 6"  (1.676 m)   Wt 186 lb 8 oz (84.6 kg)   LMP 06/30/2018 (Approximate)   SpO2 98%   BMI 30.10 kg/m  General:   Well developed, NAD,  BMI noted. HEENT:  Normocephalic . Face symmetric, atraumatic MSK: Neck: Full range of motion R shoulder: Normal L shoulder: Passive range of motion is satisfactory.  Active range of motion: Is full with some pain. Lower extremities: no pretibial edema bilaterally  Skin: Not pale. Not jaundice Neurologic:  alert & oriented X3.  Speech normal, gait appropriate for age and unassisted.  DTR symmetric.  Strength symmetric Psych--  Cognition and judgment appear intact.  Cooperative with normal attention span and concentration.  Behavior appropriate. No anxious or depressed appearing.      Assessment       Assessment A1C 5.8 Multiple sclerosis: neuro @ [facility].  DX 2003 Essential tremor HTN Pulmonary nodule, last CT 05-2015, stable, no further scans Gestational diabetes Proteinuria: Mild, chronic Contraception: Husband vasectomy  PLAN: L shoulder pain: In the context of doing work above her head for 2 to 3 weeks.  Overall improved compared to the onset of symptoms. She has a history of MS but pain as described above has not been a sign of exacerbation. Plan:  Continue ibuprofen with a strict GI precautions.  Start Prilosec for 2 weeks  for GI protection. Alternate with Tylenol. Will call for a referral if not gradually better or if symptoms resurface. Stress: Related to her mother who is my patient, we had a conversation about it. Due for a CPX at her convenience  This visit occurred during the SARS-CoV-2 public health emergency.  Safety protocols were in place, including screening questions prior to the visit, additional usage of staff PPE, and extensive cleaning of exam room while observing appropriate contact time as indicated for disinfecting solutions.

## 2020-12-05 NOTE — Patient Instructions (Addendum)
IBUPROFEN (Advil or Motrin) 200 mg 1 or 2 tablets every 8 hours as needed for pain.  Always take it with food because may cause gastritis and ulcers.  If you notice nausea, stomach pain, change in the color of stools --->  Stop the medicine and let us know  Tylenol  500 mg OTC 2 tabs a day every 8 hours as needed for pain  Prilosec OTC 1 tab a day x 2 week early in AM   Call if no better

## 2020-12-06 NOTE — Assessment & Plan Note (Signed)
L shoulder pain: In the context of doing work above her head for 2 to 3 weeks.  Overall improved compared to the onset of symptoms. She has a history of MS but pain as described above has not been a sign of exacerbation. Plan:  Continue ibuprofen with a strict GI precautions.  Start Prilosec for 2 weeks for GI protection. Alternate with Tylenol. Will call for a referral if not gradually better or if symptoms resurface. Stress: Related to her mother who is my patient, we had a conversation about it. Due for a CPX at her convenience

## 2020-12-08 ENCOUNTER — Encounter: Payer: Self-pay | Admitting: Internal Medicine

## 2021-01-08 ENCOUNTER — Other Ambulatory Visit: Payer: Self-pay | Admitting: Internal Medicine

## 2021-02-06 ENCOUNTER — Emergency Department (HOSPITAL_BASED_OUTPATIENT_CLINIC_OR_DEPARTMENT_OTHER): Payer: 59

## 2021-02-06 ENCOUNTER — Telehealth: Payer: Self-pay | Admitting: Internal Medicine

## 2021-02-06 ENCOUNTER — Other Ambulatory Visit: Payer: Self-pay

## 2021-02-06 ENCOUNTER — Encounter (HOSPITAL_BASED_OUTPATIENT_CLINIC_OR_DEPARTMENT_OTHER): Payer: Self-pay | Admitting: *Deleted

## 2021-02-06 ENCOUNTER — Emergency Department (HOSPITAL_BASED_OUTPATIENT_CLINIC_OR_DEPARTMENT_OTHER)
Admission: EM | Admit: 2021-02-06 | Discharge: 2021-02-06 | Disposition: A | Discharge status: Home / Self Care | Payer: 59 | Attending: Emergency Medicine | Admitting: Emergency Medicine

## 2021-02-06 DIAGNOSIS — R55 Syncope and collapse: Secondary | ICD-10-CM

## 2021-02-06 DIAGNOSIS — Z79899 Other long term (current) drug therapy: Secondary | ICD-10-CM | POA: Insufficient documentation

## 2021-02-06 DIAGNOSIS — I10 Essential (primary) hypertension: Secondary | ICD-10-CM | POA: Insufficient documentation

## 2021-02-06 LAB — COMPREHENSIVE METABOLIC PANEL
ALT: 24 U/L (ref 0–44)
AST: 17 U/L (ref 15–41)
Albumin: 4.5 g/dL (ref 3.5–5.0)
Alkaline Phosphatase: 72 U/L (ref 38–126)
Anion gap: 12 (ref 5–15)
BUN: 13 mg/dL (ref 6–20)
CO2: 21 mmol/L — ABNORMAL LOW (ref 22–32)
Calcium: 9.7 mg/dL (ref 8.9–10.3)
Chloride: 104 mmol/L (ref 98–111)
Creatinine, Ser: 0.58 mg/dL (ref 0.44–1.00)
GFR, Estimated: >60 mL/min (ref >60)
Glucose, Bld: 95 mg/dL (ref 70–99)
Potassium: 4 mmol/L (ref 3.5–5.1)
Sodium: 137 mmol/L (ref 135–145)
Total Bilirubin: 0.6 mg/dL (ref 0.3–1.2)
Total Protein: 7.9 g/dL (ref 6.5–8.1)

## 2021-02-06 LAB — CBC WITH DIFFERENTIAL/PLATELET
Abs Immature Granulocytes: 0.04 10*3/uL (ref 0.00–0.07)
Basophils Absolute: 0.1 10*3/uL (ref 0.0–0.1)
Basophils Relative: 1 %
Eosinophils Absolute: 0.1 10*3/uL (ref 0.0–0.5)
Eosinophils Relative: 1 %
HCT: 50 % — ABNORMAL HIGH (ref 36.0–46.0)
Hemoglobin: 16.7 g/dL — ABNORMAL HIGH (ref 12.0–15.0)
Immature Granulocytes: 0 %
Lymphocytes Relative: 17 %
Lymphs Abs: 2.2 10*3/uL (ref 0.7–4.0)
MCH: 28.8 pg (ref 26.0–34.0)
MCHC: 33.4 g/dL (ref 30.0–36.0)
MCV: 86.4 fL (ref 80.0–100.0)
Monocytes Absolute: 0.7 10*3/uL (ref 0.1–1.0)
Monocytes Relative: 5 %
Neutro Abs: 10 10*3/uL — ABNORMAL HIGH (ref 1.7–7.7)
Neutrophils Relative %: 76 %
Platelets: 316 10*3/uL (ref 150–400)
RBC: 5.79 MIL/uL — ABNORMAL HIGH (ref 3.87–5.11)
RDW: 13.3 % (ref 11.5–15.5)
WBC: 13.2 10*3/uL — ABNORMAL HIGH (ref 4.0–10.5)
nRBC: 0 % (ref 0.0–0.2)

## 2021-02-06 NOTE — Telephone Encounter (Signed)
Awaiting triage note.  

## 2021-02-06 NOTE — Telephone Encounter (Signed)
FYI. Pt at ED.  °

## 2021-02-06 NOTE — ED Notes (Signed)
Attempted blood draw without success. RN aware and to attempt shortly

## 2021-02-06 NOTE — Telephone Encounter (Signed)
Noted, thank you.  Will see her on  follow-up when needed

## 2021-02-06 NOTE — Telephone Encounter (Signed)
Nurse Assessment Nurse: Allie Bossier, RN, [place] Date/Time Maria Graves Time): 02/06/2021 8:49:37 AM Confirm and document reason for call. If symptomatic, describe symptoms. ---Caller states she had a fall yesterday. She states while she was sitting on the toilet she got nauseous and passed out. She states her ribs are sore. She woke up vomiting after she passed out. She does not believe she hit her head. She also has a bruise on her knee. Does the patient have any new or worsening symptoms? ---Yes Will a triage be completed? ---Yes Related visit to physician within the last 2 weeks? ---No Does the PT have any chronic conditions? (i.e. diabetes, asthma, this includes High risk factors for pregnancy, etc.) ---Yes List chronic conditions. ---Maria Graves Is the patient pregnant or possibly pregnant? (Ask all females between the ages of 38-55) ---No Is this a behavioral health or substance abuse call? ---No Guidelines Guideline Title Affirmed Question Affirmed Notes Nurse Date/Time (Eastern Time) Head Injury Vomiting once or more Allie Bossier, RN, [place] 02/06/2021 8:51:45 AM PLEASE NOTE: All timestamps contained within this report are represented as [place] Standard Time. CONFIDENTIALTY NOTICE: This fax transmission is intended only for the addressee. It contains information that is legally privileged, confidential or otherwise protected from use or disclosure. If you are not the intended recipient, you are strictly prohibited from reviewing, disclosing, copying using or disseminating any of this information or taking any action in reliance on or regarding this information. If you have received this fax in error, please notify us immediately by telephone so that we can arrange for its return to [place]. Phone: [phone], Toll-Free: [phone], Fax: [phone] Page: 2 of 2 Call Id: [id number] Disp. Time Maria Graves Time) Disposition Final User 02/06/2021 8:48:11 AM Send to Urgent Queue Merrily Pew 02/06/2021  8:54:32 AM Go to ED Now Yes Allie Bossier, RN, Theodosia Quay Disagree/Comply Comply Caller Understands Yes PreDisposition Call Doctor Care Advice Given Per Guideline GO TO ED NOW: Referrals [facility] - ED

## 2021-02-06 NOTE — ED Triage Notes (Signed)
Yesterday am using restroom,  got hot, dizzy and nausea, next thing she remembers woke up on floor had hit rt side on trash can and was throwing up  did not hit head , denies cp no shortness of breath   at present has rib soreness, some nausea and very tired   called md and was told to come hear

## 2021-02-06 NOTE — Telephone Encounter (Signed)
Patient called stating she fell yesterday morning and passed out for a few seconds. When she woke up she started throwing up. Pt triaged.

## 2021-02-06 NOTE — ED Notes (Signed)
Pt ambulated to [facility]

## 2021-02-06 NOTE — ED Provider Notes (Signed)
[facility] EMERGENCY DEPARTMENT Provider Note   CSN: [id number] Arrival date & time: 02/06/21  1002     History  Chief Complaint  Patient presents with   Loss of Consciousness    Maria Graves is a 51 y.o. female who presents today on recommendation by her PCP for evaluation after syncopal episode yesterday.  Patient states that she was preparing to get in the shower yesterday afternoon when she sat down on the toilet to have a bowel movement while bearing down to have a bowel movement she all of a sudden got flushed, felt very nauseous, lightheaded, and apparently syncopized.  She states that she fell off the toilet towards the right and her right side of her ribs on the trash can.  After she came to she did vomit once.  She denies falling all the way to the ground and denies any head trauma.  She denies any episode of shortness of breath, palpitations, or chest pain throughout the whole incident.  Today she is concerned for some right-sided rib soreness and some mild fatigue.  She does state that after she recovered from her syncope and vomiting she did have a large diarrheal bowel movement.  States that this was dark in color but states that she is on iron supplementation and that she has darker stools since beginning this.  Denies any true melena or hematochezia.  She does not have any history of syncope or any history of cardiac abnormality.  I have personally reviewed this patient's medical records.  She does have history of multiple sclerosis, hypertension, and proteinuria.  She has essential tremor at rest of her head.  She is not on any anticoagulation.  She is on Ocrevus for her MS.  HPI     Home Medications Prior to Admission medications   Medication Sig Start Date End Date Taking? Authorizing Provider  ferrous sulfate 324 MG TBEC Take 324 mg by mouth.   Yes [provider]  Bacillus Coagulans-Inulin (PROBIOTIC) 1-250 BILLION-MG CAPS  01/29/14   [provider]  Cholecalciferol (VITAMIN D3) 2000 units capsule Take 4,000 Units by mouth daily.    [provider]  famotidine (PEPCID) 10 MG tablet Take 10 mg by mouth daily. Patient not taking: No sig reported    [provider]  lisinopril (ZESTRIL) 10 MG tablet TAKE 2 TABLETS BY MOUTH EVERY DAY 01/09/21   Colon Branch, MD  Ocrelizumab (OCREVUS IV) Inject into the vein.    [provider]  TURMERIC PO Take by mouth.    [provider]      Allergies    Phenazopyridine, Erythromycin base, Sulfa antibiotics, Tetanus toxoids, and Macrobid [nitrofurantoin macrocrystal]    Review of Systems   Review of Systems  Constitutional: Negative.   HENT: Negative.    Eyes: Negative.   Respiratory: Negative.    Cardiovascular: Negative.   Gastrointestinal:  Positive for diarrhea, nausea and vomiting. Negative for blood in stool.  Genitourinary: Negative.   Musculoskeletal:  Positive for myalgias.  Skin: Negative.   Neurological:  Positive for tremors, syncope, weakness, light-headedness and headaches. Negative for facial asymmetry.  Hematological: Negative.   Psychiatric/Behavioral: Negative.     Physical Exam Updated Vital Signs BP 118/68 (BP Location: Left Arm)    Pulse 82    Temp 98.6 F (37 C)    Resp 18    Ht 5\' 7"  (1.702 m)    Wt 83.9 kg    LMP 06/30/2018 (Approximate)  SpO2 100%    BMI 28.98 kg/m  Physical Exam Vitals and nursing note reviewed.  Constitutional:      Appearance: She is not ill-appearing or toxic-appearing.  HENT:     Head: Normocephalic and atraumatic.     Nose: Nose normal.     Mouth/Throat:     Mouth: Mucous membranes are moist.     Pharynx: No oropharyngeal exudate or posterior oropharyngeal erythema.  Eyes:     General:        Right eye: No discharge.        Left eye: No discharge.     Extraocular Movements: Extraocular movements intact.     Conjunctiva/sclera: Conjunctivae normal.     Pupils: Pupils are equal, round,  and reactive to light.  Cardiovascular:     Rate and Rhythm: Normal rate and regular rhythm.     Pulses: Normal pulses.     Heart sounds: Normal heart sounds. No murmur heard. Pulmonary:     Effort: Pulmonary effort is normal. Tachypnea present. No accessory muscle usage, prolonged expiration or respiratory distress.     Breath sounds: Normal breath sounds. No wheezing or rales.  Chest:     Chest wall: Tenderness present. No deformity, swelling, crepitus or edema.    Abdominal:     General: Bowel sounds are normal. There is no distension.     Palpations: Abdomen is soft.     Tenderness: There is no abdominal tenderness. There is no guarding or rebound.  Musculoskeletal:        General: No deformity.     Cervical back: Normal range of motion and neck supple.     Right lower leg: No edema.     Left lower leg: No edema.  Lymphadenopathy:     Cervical: No cervical adenopathy.  Skin:    General: Skin is warm and dry.     Capillary Refill: Capillary refill takes less than 2 seconds.  Neurological:     General: No focal deficit present.     Mental Status: She is alert and oriented to person, place, and time. Mental status is at baseline.     GCS: GCS eye subscore is 4. GCS verbal subscore is 5. GCS motor subscore is 6.     Motor: Tremor present.     Gait: Gait is intact.     Comments: Essential tremor at patient's baseline.  Ambulatory without difficulty in the emergency department.  Psychiatric:        Mood and Affect: Mood normal.    ED Results / Procedures / Treatments   Labs (all labs ordered are listed, but only abnormal results are displayed) Labs Reviewed  CBC WITH DIFFERENTIAL/PLATELET - Abnormal; Notable for the following components:      Result Value   WBC 13.2 (*)    RBC 5.79 (*)    Hemoglobin 16.7 (*)    HCT 50.0 (*)    Neutro Abs 10.0 (*)    All other components within normal limits  COMPREHENSIVE METABOLIC PANEL - Abnormal; Notable for the following components:    CO2 21 (*)    All other components within normal limits    EKG EKG Interpretation  Date/Time:  Monday February 06 2021 12:21:53 EST Ventricular Rate:  76 PR Interval:  126 QRS Duration: 93 QT Interval:  398 QTC Calculation: 448 R Axis:   68 Text Interpretation: Sinus rhythm No old tracing to compare Confirmed by Malvin Johns [id number]) on 02/06/2021 12:34:02 PM  Radiology DG Ribs  Unilateral W/Chest Right  Result Date: 02/06/2021 CLINICAL DATA:  Fall after syncopal episode, rib pain EXAM: RIGHT RIBS AND CHEST - 3+ VIEW COMPARISON:  None. FINDINGS: No fracture or other bone lesions are seen involving the ribs. There is no evidence of pneumothorax or pleural effusion. Both lungs are clear. Heart size and mediastinal contours are within normal limits. IMPRESSION: Negative. Electronically Signed   By: Macy Mis M.D.   On: 02/06/2021 11:38    Procedures Procedures    Medications Ordered in ED Medications - No data to display  ED Course/ Medical Decision Making/ A&P                           Medical Decision Making This patient presents to the ED for concern of syncope, this involves an extensive number of treatment options, and is a complaint that carries with it a high risk of complications and morbidity.  The differential diagnosis includes but is not limited to: arrythmia (Vtach, SVT, SSS, sinus arrest, AV block, bradycardia), aortic stenosis, AMI, hypertrophic obstructive cardiomyopathy (HOCM), PE, atrial myxoma, pulmonary hypertension, orthostatic hypotension, hypovolemia, drug effect, GB syndrome, micturition, cough, carotid sinus sensitivity, seizure, TIA/CVA, hypoglycemia, vertigo, vasovagal episode.  51 year old female with history of multiple sclerosis who presents with episode of syncope yesterday.  Normal vital signs on intake.  Cardiopulmonary exam and abdominal exams are benign.  Patient with bruising and tenderness palpation over the right lateral ribs without deformity  or crepitus.  Neurologically at her baseline with essential tremor and no focal deficit on neuro exam.  Laboratory studies are reassuring though do appear mildly hemoconcentrated on CBC.  In context of patient's episode during bearing down to have a bowel movement suspect that etiology of her syncopal episode was most likely a vasovagal episode.  No evidence of heart block, dysrhythmia on her EKG or cardiac monitoring today.  She is low risk for PE and given she has not had any shortness of breath, palpitations, or change in her vital signs doubt this is her etiology of her symptoms.  She does not have any recent new medications nor is she experiencing any symptoms that would concern me for CVA.  Comorbidities that complicate the patient evaluation: MS Hypertension   Additional history obtained:  Additional history obtained from chart review External records from outside source obtained and reviewed including none   Lab Tests:  I Ordered, reviewed, and interpreted labs.  The pertinent results include: CBC which appeared hemoconcentrated with WBC of 13,000, hemoglobin of 16,000, otherwise unremarkable.  CMP without abnormality   Imaging Studies ordered:  I ordered imaging studies including plain film of the ribs and the right with the chest I independently visualized and interpreted imaging which showed which was negative for acute osseous abnormality or cardiopulmonary abnormality I agree with the radiologist interpretation   Cardiac Monitoring:  The patient was maintained on a cardiac monitor.  I personally viewed and interpreted the cardiac monitored which showed an underlying rhythm of: Normal sinus rhythm   Medicines ordered and prescription drug management:  None   Tests Considered: No additional testing was considered given nonfocal neurologic exam and normal EKG/cardiac monitoring   Critical Interventions:  None   Consultations Obtained:  None   Problem List /  ED Course: Rib pain: Oral analgesia offered, patient declined.    Reevaluation:  After the interventions noted above, I reevaluated the patient and found that they have : Remained stable throughout her stay  in the emergency department without abnormality on her cardiac monitoring or exam.  Ambulatory throughout the emergency department without difficulty  Social Determinants of Health   Dispostion:  After consideration of the diagnostic results and the patients response to treatment feel that the patent would benefit from discharge home with close outpatient follow-up.  Head no admission indication required at this time given normal evaluation and work-up today.  HPI most consistent with vasovagal episode.  As discussed with the patient, cannot definitively rule out dysrhythmia as there is no cardiac monitoring in place at time of the patient's syncopal episode, however history and work-up today are quite reassuring.  Patient may follow-up in the outpatient setting with a cardiologist for possible long-term cardiac monitor, or may continue to follow-up outpatient with her PCP.  No further work-up warranted in the ER at this time. Arla voiced understanding for medical evaluation and treatment plan.  Each of her questions was answered to her expressed satisfaction.  Strict return precautions were given.  Patient is well-appearing, stable, and was discharged in good condition.  This chart was dictated using voice recognition software, Dragon. Despite the best efforts of this provider to proofread and correct errors, errors may still occur which can change documentation meaning.  Final Clinical Impression(s) / ED Diagnoses Final diagnoses:  None    Rx / DC Orders ED Discharge Orders     None         Emeline Darling, PA-C 02/07/21 1100    Malvin Johns, MD 02/07/21 1607

## 2021-02-06 NOTE — ED Notes (Signed)
ED Provider at bedside. 

## 2021-02-06 NOTE — Discharge Instructions (Signed)
You were seen in the ER today after syncopal episode yesterday.  Suspect you had a vasovagal episode, which is an involuntary reflex reaction that can cause you to pass out.  You may use Tylenol and ibuprofen alternating every 3 hours as needed for the discomfort in your ribs.  Fortunately, there not appear to be any broken bones on your x-ray.  Please follow-up with your primary care doctor and return to the ER if you have any difficulty breathing, nausea or vomiting that does not stop, chest pain, palpitations, or any other new severe symptom.

## 2021-02-10 ENCOUNTER — Ambulatory Visit (HOSPITAL_BASED_OUTPATIENT_CLINIC_OR_DEPARTMENT_OTHER)
Admission: RE | Admit: 2021-02-10 | Discharge: 2021-02-10 | Disposition: A | Discharge status: Home / Self Care | Payer: 59 | Source: Ambulatory Visit | Attending: Internal Medicine | Admitting: Internal Medicine

## 2021-02-10 ENCOUNTER — Other Ambulatory Visit: Payer: Self-pay

## 2021-02-10 ENCOUNTER — Encounter: Payer: Self-pay | Admitting: Internal Medicine

## 2021-02-10 ENCOUNTER — Ambulatory Visit (INDEPENDENT_AMBULATORY_CARE_PROVIDER_SITE_OTHER): Payer: 59 | Admitting: Internal Medicine

## 2021-02-10 VITALS — BP 124/80 | HR 94 | Temp 98.1°F | Resp 16 | Ht 66.0 in | Wt 190.5 lb

## 2021-02-10 DIAGNOSIS — R739 Hyperglycemia, unspecified: Secondary | ICD-10-CM | POA: Diagnosis not present

## 2021-02-10 DIAGNOSIS — D72829 Elevated white blood cell count, unspecified: Secondary | ICD-10-CM | POA: Diagnosis not present

## 2021-02-10 DIAGNOSIS — Z0001 Encounter for general adult medical examination with abnormal findings: Secondary | ICD-10-CM

## 2021-02-10 DIAGNOSIS — Z0000 Encounter for general adult medical examination without abnormal findings: Secondary | ICD-10-CM

## 2021-02-10 DIAGNOSIS — G35 Multiple sclerosis: Secondary | ICD-10-CM

## 2021-02-10 DIAGNOSIS — S20211D Contusion of right front wall of thorax, subsequent encounter: Secondary | ICD-10-CM | POA: Insufficient documentation

## 2021-02-10 DIAGNOSIS — R55 Syncope and collapse: Secondary | ICD-10-CM

## 2021-02-10 DIAGNOSIS — I10 Essential (primary) hypertension: Secondary | ICD-10-CM

## 2021-02-10 LAB — LIPID PANEL
Cholesterol: 171 mg/dL (ref 0–200)
HDL: 48.9 mg/dL (ref >39.00)
NonHDL: 122.14
Total CHOL/HDL Ratio: 3
Triglycerides: 243 mg/dL — ABNORMAL HIGH (ref 0.0–149.0)
VLDL: 48.6 mg/dL — ABNORMAL HIGH (ref 0.0–40.0)

## 2021-02-10 LAB — TSH: TSH: 1.25 u[IU]/mL (ref 0.35–5.50)

## 2021-02-10 LAB — HEMOGLOBIN A1C: Hgb A1c MFr Bld: 6 % (ref 4.6–6.5)

## 2021-02-10 LAB — LDL CHOLESTEROL, DIRECT: Direct LDL: 94 mg/dL

## 2021-02-10 NOTE — Assessment & Plan Note (Signed)
--  Td: intolerant, ++ swelling - PNM 20 offered. -Shingrix offered. - s/p covid vax x 2, declines booster - declines flu shot --Female care per Gyn.  Pap smear 06-2020.  MMG 08-2020 --Never had a cscope,    cologuard - 01/05/2021.  --Labs: CBC with pathology review due to elevated white cells, FLP, A1c, TSH --Tobacco: Still smoking some.  Also uses nicotine supplements, not ready to quit, encouraged to think about it. --lifestyle: Doing great, eating healthier, + weight loss.

## 2021-02-10 NOTE — Progress Notes (Signed)
Subjective:    Patient ID: Maria Graves, female    DOB: 24-Dec-1970, 51 y.o.   MRN: [id number]  DOS:  02/10/2021 Type of visit - description: CPX  In addition to CPX, we had extensive discussion about her recent ER visit.  Went to the ER for 02/06/2021, after a syncope spell. She bear down to have a bowel movement, stools were loose and black (takes iron), she felt sweaty, dizzy, eventually fell, LOC?  She is unsure, perhaps 1 or 2 seconds. She really hurt her right chest.  Denies any major head or neck injury or pain.  Some pain on the left knee. Work-up at the ER: CMP, CBC, EKG were essentially normal except for a white count of 13.2 X-rays: No rib fractures.  Today, she is still very sore at the whole right lateral chest,  the area looks swollen to her.  Some pain at the right upper quadrant of the abdomen as well. No fever chills No difficulty breathing other than pain at the chest with deep breaths. Good p.o. tolerance, no nausea or vomiting, stools are now normal.   Review of Systems  Other than above, a 14 point review of systems is negative       Past Medical History:  Diagnosis Date   Contraception    husband vasectomy   Essential tremor    Gestational diabetes    HTN (hypertension)    MS (multiple sclerosis) ([place])    Proteinuria    Reports mild, chronic proteinuria    Past Surgical History:  Procedure Laterality Date   TONSILLECTOMY  13   Social History   Socioeconomic History   Marital status: Married    Spouse name: Not on file   Number of children: 2   Years of education: Not on file   Highest education level: Not on file  Occupational History   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   Tobacco Use   Smoking status: Former    Types: Cigarettes    Quit date: 02/11/2017    Years since quitting: 4.0   Smokeless tobacco: Never   Tobacco comments:    tobacco restart ~ 09-2019, 2 cigarrets sometimes   Vaping Use   Vaping Use: Never used   Substance and Sexual Activity   Alcohol use: Yes    Alcohol/week: 0.0 standard drinks    Comment: Socially   Drug use: No   Sexual activity: Yes    Partners: Male    Comment: 1st intercourse- 56, partners- , married- 53 yrs   Other Topics Concern   Not on file  Social History Narrative   Moved from [place] July 2014   G3P2   Son going to [employer], Social research officer, government    2 children at home (college , senior [employer] )   Social Determinants of Health   Financial Resource Strain: Not on file  Food Insecurity: Not on file  Transportation Needs: Not on file  Physical Activity: Not on file  Stress: Not on file  Social Connections: Not on file  Intimate Partner Violence: Not on file     Current Outpatient Medications  Medication Instructions   Bacillus Coagulans-Inulin (PROBIOTIC) 1-250 BILLION-MG CAPS No dose, route, or frequency recorded.   famotidine (PEPCID) 10 mg, Daily   lisinopril (ZESTRIL) 10 MG tablet TAKE 2 TABLETS BY MOUTH EVERY DAY   Ocrelizumab (OCREVUS IV) Intravenous   TURMERIC PO Oral   Vitamin D3 4,000 Units, Oral, Daily  Objective:   Physical Exam Skin:         Comments: Right lateral chest: Has a bruise, no deformities or crepitus.  + TTP  BP 124/80 (BP Location: Left Arm, Patient Position: Sitting, Cuff Size: Small)    Pulse 94    Temp 98.1 F (36.7 C) (Oral)    Resp 16    Ht 5\' 6"  (1.676 m)    Wt 190 lb 8 oz (86.4 kg)    LMP 06/30/2018 (Approximate)    SpO2 98%    BMI 30.75 kg/m  General: Well developed, NAD, BMI noted Neck: No  thyromegaly  HEENT:  Normocephalic . Face symmetric, atraumatic Lungs:  CTA B Normal respiratory effort, no intercostal retractions, no accessory muscle use. Heart: RRR,  no murmur.  Abdomen:  Not distended, soft, non-tender.  Specifically, RUQ abdomen is soft, no organomegaly, only tender when I get close to the rib cage. Lower extremities: no pretibial edema bilaterally  Skin: Exposed areas without rash. Not pale. Not  jaundice Neurologic:  alert & oriented X3.  Speech normal, gait appropriate for age and unassisted Strength symmetric and appropriate for age.  Psych: Cognition and judgment appear intact.  Cooperative with normal attention span and concentration.  Behavior appropriate. No anxious or depressed appearing.     Assessment     Assessment A1C 5.8 Multiple sclerosis: neuro @ [facility].  DX 2003 Essential tremor HTN Pulmonary nodule, last CT 05-2015, stable, no further scans Gestational diabetes Proteinuria: Mild, chronic Contraception: Husband vasectomy  PLAN: For CPX Hyperglycemia: Check A1c MS: Follow-up elsewhere HTN: Since she changed her diet, BP is in the low side sometimes, she is already holding one of the lisinoprils as needed.  I agree, if consistently low BPs, to take only 1 lisinopril a day. Syncope: Agree with ER MD, likely vasovagal.  Rx observation Rib cage contusion: Recheck x-ray.  Patient quite concerned about the pain and swelling at the area, on physical exam there is no objective swelling that I can tell.  Doubt intra-abdominal injury. Rx  Tylenol, call if not gradually better in the next 2 weeks. Iron supplements: self started when she changes her diet, ok to d/c  RTC 1 year, sooner if needed      This visit occurred during the SARS-CoV-2 public health emergency.  Safety protocols were in place, including screening questions prior to the visit, additional usage of staff PPE, and extensive cleaning of exam room while observing appropriate contact time as indicated for disinfecting solutions.

## 2021-02-10 NOTE — Patient Instructions (Addendum)
Check the  blood pressure regularly BP GOAL is between 110/65 and  135/85. If it is consistently higher or lower, let me know or hold one of your lisinoprils    GO TO THE LAB : Get the blood work     [place], [place] back for a yearly checkup  Go down to the first floor, get x-ray

## 2021-02-10 NOTE — Assessment & Plan Note (Signed)
For CPX Hyperglycemia: Check A1c MS: Follow-up elsewhere HTN: Since she changed her diet, BP is in the low side sometimes, she is already holding one of the lisinoprils as needed.  I agree, if consistently low BPs, to take only 1 lisinopril a day. Syncope: Agree with ER MD, likely vasovagal.  Rx observation Rib cage contusion: Recheck x-ray.  Patient quite concerned about the pain and swelling at the area, on physical exam there is no objective swelling that I can tell.  Doubt intra-abdominal injury. Rx  Tylenol, call if not gradually better in the next 2 weeks. Iron supplements: self started when she changes her diet, ok to d/c  RTC 1 year, sooner if needed

## 2021-02-13 LAB — CBC WITH DIFFERENTIAL/PLATELET
Absolute Monocytes: 592 cells/uL (ref 200–950)
Basophils Absolute: 66 cells/uL (ref 0–200)
Basophils Relative: 0.7 %
Eosinophils Absolute: 216 cells/uL (ref 15–500)
Eosinophils Relative: 2.3 %
HCT: 42.8 % (ref 35.0–45.0)
Hemoglobin: 14.2 g/dL (ref 11.7–15.5)
Lymphs Abs: 2265 cells/uL (ref 850–3900)
MCH: 28.4 pg (ref 27.0–33.0)
MCHC: 33.2 g/dL (ref 32.0–36.0)
MCV: 85.6 fL (ref 80.0–100.0)
MPV: 11.7 fL (ref 7.5–12.5)
Monocytes Relative: 6.3 %
Neutro Abs: 6260 cells/uL (ref 1500–7800)
Neutrophils Relative %: 66.6 %
Platelets: 272 10*3/uL (ref 140–400)
RBC: 5 10*6/uL (ref 3.80–5.10)
RDW: 12.9 % (ref 11.0–15.0)
Total Lymphocyte: 24.1 %
WBC: 9.4 10*3/uL (ref 3.8–10.8)

## 2021-02-13 LAB — PATHOLOGIST SMEAR REVIEW

## 2021-07-11 ENCOUNTER — Other Ambulatory Visit: Payer: Self-pay | Admitting: Internal Medicine

## 2021-08-25 ENCOUNTER — Other Ambulatory Visit: Payer: Self-pay | Admitting: Internal Medicine

## 2021-08-25 DIAGNOSIS — Z1231 Encounter for screening mammogram for malignant neoplasm of breast: Secondary | ICD-10-CM

## 2021-09-25 ENCOUNTER — Ambulatory Visit
Admission: RE | Admit: 2021-09-25 | Discharge: 2021-09-25 | Disposition: A | Discharge status: Home / Self Care | Payer: 59 | Source: Ambulatory Visit | Attending: Internal Medicine | Admitting: Internal Medicine

## 2021-09-25 DIAGNOSIS — Z1231 Encounter for screening mammogram for malignant neoplasm of breast: Secondary | ICD-10-CM

## 2022-01-13 ENCOUNTER — Other Ambulatory Visit: Payer: Self-pay | Admitting: Internal Medicine

## 2022-02-13 ENCOUNTER — Ambulatory Visit (INDEPENDENT_AMBULATORY_CARE_PROVIDER_SITE_OTHER): Payer: 59 | Admitting: Internal Medicine

## 2022-02-13 ENCOUNTER — Encounter: Payer: Self-pay | Admitting: Internal Medicine

## 2022-02-13 VITALS — BP 132/74 | HR 76 | Temp 98.0°F | Resp 16 | Ht 66.0 in | Wt 200.5 lb

## 2022-02-13 DIAGNOSIS — R739 Hyperglycemia, unspecified: Secondary | ICD-10-CM | POA: Diagnosis not present

## 2022-02-13 DIAGNOSIS — G35 Multiple sclerosis: Secondary | ICD-10-CM

## 2022-02-13 DIAGNOSIS — E782 Mixed hyperlipidemia: Secondary | ICD-10-CM

## 2022-02-13 DIAGNOSIS — I10 Essential (primary) hypertension: Secondary | ICD-10-CM

## 2022-02-13 DIAGNOSIS — Z0000 Encounter for general adult medical examination without abnormal findings: Secondary | ICD-10-CM

## 2022-02-13 DIAGNOSIS — E559 Vitamin D deficiency, unspecified: Secondary | ICD-10-CM | POA: Diagnosis not present

## 2022-02-13 DIAGNOSIS — Z1211 Encounter for screening for malignant neoplasm of colon: Secondary | ICD-10-CM

## 2022-02-13 LAB — COMPREHENSIVE METABOLIC PANEL
ALT: 19 U/L (ref 0–35)
AST: 13 U/L (ref 0–37)
Albumin: 4.5 g/dL (ref 3.5–5.2)
Alkaline Phosphatase: 67 U/L (ref 39–117)
BUN: 11 mg/dL (ref 6–23)
CO2: 27 mEq/L (ref 19–32)
Calcium: 9.5 mg/dL (ref 8.4–10.5)
Chloride: 102 mEq/L (ref 96–112)
Creatinine, Ser: 0.72 mg/dL (ref 0.40–1.20)
GFR: 95.15 mL/min (ref >60.00)
Glucose, Bld: 94 mg/dL (ref 70–99)
Potassium: 4.5 mEq/L (ref 3.5–5.1)
Sodium: 139 mEq/L (ref 135–145)
Total Bilirubin: 0.5 mg/dL (ref 0.2–1.2)
Total Protein: 6.8 g/dL (ref 6.0–8.3)

## 2022-02-13 LAB — CBC WITH DIFFERENTIAL/PLATELET
Basophils Absolute: 0.1 10*3/uL (ref 0.0–0.1)
Basophils Relative: 0.9 % (ref 0.0–3.0)
Eosinophils Absolute: 0.3 10*3/uL (ref 0.0–0.7)
Eosinophils Relative: 3.4 % (ref 0.0–5.0)
HCT: 44.7 % (ref 36.0–46.0)
Hemoglobin: 14.9 g/dL (ref 12.0–15.0)
Lymphocytes Relative: 31.4 % (ref 12.0–46.0)
Lymphs Abs: 2.7 10*3/uL (ref 0.7–4.0)
MCHC: 33.4 g/dL (ref 30.0–36.0)
MCV: 85.9 fl (ref 78.0–100.0)
Monocytes Absolute: 0.8 10*3/uL (ref 0.1–1.0)
Monocytes Relative: 9.1 % (ref 3.0–12.0)
Neutro Abs: 4.7 10*3/uL (ref 1.4–7.7)
Neutrophils Relative %: 55.2 % (ref 43.0–77.0)
Platelets: 275 10*3/uL (ref 150.0–400.0)
RBC: 5.2 Mil/uL — ABNORMAL HIGH (ref 3.87–5.11)
RDW: 12.9 % (ref 11.5–15.5)
WBC: 8.5 10*3/uL (ref 4.0–10.5)

## 2022-02-13 LAB — LIPID PANEL
Cholesterol: 206 mg/dL — ABNORMAL HIGH (ref 0–200)
HDL: 53.5 mg/dL (ref >39.00)
NonHDL: 152.39
Total CHOL/HDL Ratio: 4
Triglycerides: 220 mg/dL — ABNORMAL HIGH (ref 0.0–149.0)
VLDL: 44 mg/dL — ABNORMAL HIGH (ref 0.0–40.0)

## 2022-02-13 LAB — HEMOGLOBIN A1C: Hgb A1c MFr Bld: 5.9 % (ref 4.6–6.5)

## 2022-02-13 LAB — VITAMIN D 25 HYDROXY (VIT D DEFICIENCY, FRACTURES): VITD: 55.54 ng/mL (ref 30.00–100.00)

## 2022-02-13 LAB — LDL CHOLESTEROL, DIRECT: Direct LDL: 112 mg/dL

## 2022-02-13 NOTE — Progress Notes (Signed)
Subjective:    Patient ID: Maria Graves, female    DOB: [birth year], 52 y.o.   MRN: [id number]  DOS:  02/13/2022 Type of visit - description: CPX  Here for CPX Went to the ED 09/07/2021, C/O dizziness and chest pain.  She also had nausea PTA. Blood work show mild leukocytosis, EKG sinus rhythm.  She received IV fluids, felt better and was discharged home. No further events, at the time she was under a lot of stress and she thinks that was the cause of sxs. Also had a single episode of right eye blurred vision, lasted 1 minute, no associated with diplopia, slurred speech or other neurological problems.  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   Tobacco Use   Smoking status: Former    Types: Cigarettes    Quit date: 02/11/2017    Years since quitting: 5.0   Smokeless tobacco: Never   Tobacco comments:    tobacco restart ~ 09-2019, 2 cigarrets sometimes   Vaping Use   Vaping Use: Never used  Substance and Sexual Activity   Alcohol use: Yes    Alcohol/week: 0.0 standard drinks of alcohol    Comment: Socially   Drug use: No   Sexual activity: Yes    Partners: Male    Comment: 1st intercourse- 65, partners- , married- 72 yrs   Other Topics Concern   Not on file  Social History Narrative   Moved from [place] July 2014   G3P2   Son Social research officer, government , doing a  Restaurant manager, fast food    2 children :college    Social Determinants of Radio broadcast assistant Strain: Not on file  Food Insecurity: Not on file  Transportation Needs: Not on file  Physical Activity: Not on file  Stress: Not on file  Social Connections: Not on file  Intimate Partner Violence: Not on file     Current Outpatient Medications  Medication Instructions   Bacillus Coagulans-Inulin (PROBIOTIC) 1-250 BILLION-MG CAPS No dose, route, or frequency recorded.   famotidine (PEPCID) 10 mg, Daily   lisinopril (ZESTRIL) 20 mg, Oral, Daily   Ocrelizumab (OCREVUS IV) Intravenous   TURMERIC PO Oral   Vitamin D3 4,000 Units, Oral, Daily       Objective:   Physical Exam BP 132/74   Pulse 76   Temp 98 F (36.7 C) (Oral)   Resp 16   Ht 5\' 6"  (1.676 m)   Wt 200 lb 8 oz (90.9 kg)   LMP 06/30/2018 (Approximate)   SpO2 98%   BMI 32.36 kg/m  General: Well developed, NAD, BMI noted Neck: No  thyromegaly  HEENT:  Normocephalic . Face symmetric, atraumatic Lungs:  CTA B Normal respiratory effort, no intercostal retractions,  no accessory muscle use. Heart: RRR,  no murmur.  Abdomen:  Not distended, soft, non-tender. No rebound or rigidity.   Lower extremities: no pretibial edema bilaterally  Skin: Exposed areas without rash. Not pale. Not jaundice Neurologic:  alert & oriented X3.  Speech normal, gait appropriate for age and unassisted Strength symmetric and appropriate for age.  Psych: Cognition and judgment appear intact.  Cooperative with normal attention span and concentration.  Behavior appropriate. No anxious or depressed appearing.     Assessment     Assessment A1C 5.8 Multiple sclerosis: neuro @ [facility].  DX 2003 Essential tremor HTN Pulmonary nodule, last CT 05-2015, stable, no further scans Gestational diabetes Proteinuria: Mild, chronic Menopausal ~ 2021 Contraception: Husband vasectomy  PLAN:  For CPX Dizziness chest pain: Was seen at the ER few months ago,  workup negative, no further events.  She thinks this was related to stress.  We agreed on observation Hyperglycemia: Check A1c. MS: Reports things are going well, due for the next infusion at the end of January. Did have a couple of minor falls, overall she feels steady and declines PT HTN: On lisinopril 10 mg, check labs. Vitamin D deficiency: On OTC supplements, checking labs Episode of blurry vision: See HPI, recommend to discuss with neurology and get a eye checkup. Social: Lost her mother last year and other fam member, had a difficult 2023 but she is doing well emotionally. RTC 1 year

## 2022-02-13 NOTE — Patient Instructions (Addendum)
Please discuss your vaccines with neurology  Recommend to see the eye doctor  We are referring you to gastroenterology for consideration of colonoscopy. You can reach them at [phone]. This is no urgent, you will be due for a colon exam in December 2024   GO TO THE LAB : Get the blood work     [place], [place] Come back for a physical exam in 1 year

## 2022-02-14 ENCOUNTER — Encounter: Payer: Self-pay | Admitting: Internal Medicine

## 2022-02-14 NOTE — Assessment & Plan Note (Signed)
For CPX Dizziness chest pain: Was seen at the ER few months ago, workup negative, no further events.  She thinks this was related to stress.  We agreed on observation Hyperglycemia: Check A1c. MS: Reports things are going well, due for the next infusion at the end of January. Did have a couple of minor falls, overall she feels steady and declines PT HTN: On lisinopril 10 mg, check labs. Vitamin D deficiency: On OTC supplements, checking labs Episode of blurry vision: See HPI, recommend to discuss with neurology and get a eye checkup. Social: Lost her mother last year and other fam member, had a difficult 2023 but she is doing well emotionally. RTC 1 year

## 2022-02-14 NOTE — Assessment & Plan Note (Signed)
-  Td: intolerant, ++ swelling - PNM, Shingrix, COVID, flu shot: Hesitant to proceed, recommend to discuss with her MS doctor  --Female care per Gyn.  Pap smear 06-2020.  MMG 08-2021 --Never had a cscope,    cologuard - 01/06/2019; lost her mother last year, possibly from colon cancer thus pt  requests a colonoscopy, will refer to GI. -Labs: C CMP FLP CBC A1c vitamin D --Tobacco: Still smoking, very little, on and off.  Counseled. --lifestyle: Doing well.

## 2022-03-15 ENCOUNTER — Encounter: Payer: Self-pay | Admitting: Internal Medicine

## 2022-03-26 ENCOUNTER — Telehealth: Payer: Self-pay

## 2022-03-26 NOTE — Telephone Encounter (Signed)
Spoke w/ Maria Graves at [facility]- she wanted to check on diagnosis codes used for lab work completed on 02/13/22- informed that we used her hx of HTN, hyperglycemia and Vit D. Maria Graves verbalized understanding. Reference number: [id number].

## 2022-04-04 ENCOUNTER — Ambulatory Visit (AMBULATORY_SURGERY_CENTER): Payer: 59

## 2022-04-04 VITALS — Ht 67.0 in | Wt 198.0 lb

## 2022-04-04 DIAGNOSIS — Z800 Family history of malignant neoplasm of digestive organs: Secondary | ICD-10-CM

## 2022-04-04 DIAGNOSIS — Z1211 Encounter for screening for malignant neoplasm of colon: Secondary | ICD-10-CM

## 2022-04-04 MED ORDER — NA SULFATE-K SULFATE-MG SULF 17.5-3.13-1.6 GM/177ML PO SOLN: 1.0000 | Freq: Once | ORAL | 0 refills | Status: AC

## 2022-04-04 NOTE — Progress Notes (Signed)
Pre visit completed via phone call; Patient verified name, DOB, and address;  No egg or soy allergy known to patient  No issues known to pt with past sedation with any surgeries or procedures Patient denies ever being told they had issues or difficulty with intubation  No FH of Malignant Hyperthermia Pt is not on diet pills Pt is not on home 02  Pt is not on blood thinners  Pt reports issues with constipation - "deals with this on a daily basis"- patient reports using prep H and controls diet to have a regular, daily bowel movement; patient also encouraged to increase water, fruits and veggies, activity;  No A fib or A flutter Have any cardiac testing pending--NO Pt instructed to use Singlecare.com or GoodRx for a price reduction on prep   Insurance verified during [place] appt=[employer]  Patient's chart reviewed by Osvaldo Angst CNRA prior to previsit and patient appropriate for the [place].  Previsit completed and red dot placed by patient's name on their procedure day (on provider's schedule).

## 2022-04-05 ENCOUNTER — Encounter: Payer: Self-pay | Admitting: Internal Medicine

## 2022-04-30 ENCOUNTER — Telehealth: Payer: Self-pay | Admitting: Internal Medicine

## 2022-04-30 NOTE — Telephone Encounter (Signed)
Patient is calling states she at some "wilted spinach" and is wanting to know if she needs to reschedule. Please advise

## 2022-04-30 NOTE — Telephone Encounter (Signed)
Enouraged pt drink plenty of fluids and to avoid items not to each from here on out,verbalized understanding. all questions answered.

## 2022-05-03 ENCOUNTER — Encounter: Payer: Self-pay | Admitting: Internal Medicine

## 2022-05-03 ENCOUNTER — Ambulatory Visit (AMBULATORY_SURGERY_CENTER): Payer: 59 | Admitting: Internal Medicine

## 2022-05-03 VITALS — BP 116/59 | HR 60 | Temp 98.7°F | Resp 17 | Ht 67.0 in | Wt 198.0 lb

## 2022-05-03 DIAGNOSIS — D124 Benign neoplasm of descending colon: Secondary | ICD-10-CM | POA: Diagnosis not present

## 2022-05-03 DIAGNOSIS — K635 Polyp of colon: Secondary | ICD-10-CM | POA: Diagnosis not present

## 2022-05-03 DIAGNOSIS — Z1211 Encounter for screening for malignant neoplasm of colon: Secondary | ICD-10-CM | POA: Diagnosis present

## 2022-05-03 DIAGNOSIS — Z800 Family history of malignant neoplasm of digestive organs: Secondary | ICD-10-CM

## 2022-05-03 MED ORDER — SODIUM CHLORIDE 0.9 % IV SOLN: 500.0000 mL | INTRAVENOUS | Status: DC

## 2022-05-03 NOTE — Patient Instructions (Addendum)
Recommendation: - Repeat colonoscopy in 7-10 years for surveillance.                           - Patient has a contact number available for                            emergencies. The signs and symptoms of potential                            delayed complications were discussed with the                            patient. Return to normal activities tomorrow.                            Written discharge instructions were provided to the                            patient.                           - Resume previous diet.                           - Continue present medications.                           - Await pathology results.  Handouts on polyps and hemorrhoids given.  YOU HAD AN ENDOSCOPIC PROCEDURE TODAY AT [facility]:   Refer to the procedure report that was given to you for any specific questions about what was found during the examination.  If the procedure report does not answer your questions, please call your gastroenterologist to clarify.  If you requested that your care partner not be given the details of your procedure findings, then the procedure report has been included in a sealed envelope for you to review at your convenience later.  YOU SHOULD EXPECT: Some feelings of bloating in the abdomen. Passage of more gas than usual.  Walking can help get rid of the air that was put into your GI tract during the procedure and reduce the bloating. If you had a lower endoscopy (such as a colonoscopy or flexible sigmoidoscopy) you may notice spotting of blood in your stool or on the toilet paper. If you underwent a bowel prep for your procedure, you may not have a normal bowel movement for a few days.  Please Note:  You might notice some irritation and congestion in your nose or some drainage.  This is from the oxygen used during your procedure.  There is no need for concern and it should clear up in a day or so.  SYMPTOMS TO REPORT IMMEDIATELY:  Following lower  endoscopy (colonoscopy or flexible sigmoidoscopy):  Excessive amounts of blood in the stool  Significant tenderness or worsening of abdominal pains  Swelling of the abdomen that is new, acute  Fever of 100F or higher  For urgent or emergent issues, a gastroenterologist can be reached at any hour by calling [phone]. Do not use MyChart messaging for urgent concerns.    DIET:  We do recommend  a small meal at first, but then you may proceed to your regular diet.  Drink plenty of fluids but you should avoid alcoholic beverages for 24 hours.  ACTIVITY:  You should plan to take it easy for the rest of today and you should NOT DRIVE or use heavy machinery until tomorrow (because of the sedation medicines used during the test).    FOLLOW UP: Our staff will call the number listed on your records the next business day following your procedure.  We will call around 7:15- 8:00 am to check on you and address any questions or concerns that you may have regarding the information given to you following your procedure. If we do not reach you, we will leave a message.     If any biopsies were taken you will be contacted by phone or by letter within the next 1-3 weeks.  Please call us at [phone] if you have not heard about the biopsies in 3 weeks.    SIGNATURES/CONFIDENTIALITY: You and/or your care partner have signed paperwork which will be entered into your electronic medical record.  These signatures attest to the fact that that the information above on your After Visit Summary has been reviewed and is understood.  Full responsibility of the confidentiality of this discharge information lies with you and/or your care-partner.

## 2022-05-03 NOTE — Progress Notes (Signed)
HISTORY OF PRESENT ILLNESS:  Maria Graves is a 52 y.o. female who is sent today for routine screening colonoscopy.  No complaints family history of colon cancer in mother greater than age 49  REVIEW OF SYSTEMS:  All non-GI ROS negative except for  Past Medical History:  Diagnosis Date   Arthritis    bilateral hands   Contraception    husband vasectomy   Essential tremor    Gestational diabetes    HTN (hypertension)    on meds   MS (multiple sclerosis)    Proteinuria    Reports mild, chronic proteinuria    Past Surgical History:  Procedure Laterality Date   TONSILLECTOMY  1989   WISDOM TOOTH EXTRACTION      Social History Maria Graves  reports that she has been smoking cigarettes. She has a 10.00 pack-year smoking history. She has never used smokeless tobacco. She reports current alcohol use. She reports that she does not use drugs.  family history includes Alcohol abuse in her mother; Arthritis in her mother; Atrial fibrillation in her father and mother; Bladder Cancer (age of onset: 91) in her mother; COPD in her mother; Cardiomyopathy in her mother; Clotting disorder in her father; Colon cancer (age of onset: 62) in her mother; Colon polyps (age of onset: 9) in her mother; Depression in her mother; Diabetes in her father, maternal grandfather, and mother; Heart attack in her paternal grandfather; Heart disease in her paternal grandfather; Heart disease (age of onset: 75) in her father; Hyperlipidemia in her father and mother; Hypertension in her brother and father; Kidney disease in her father; Obesity in her father and mother; Stroke in her paternal grandmother; Sudden death in an other family member.  Allergies  Allergen Reactions   Shellfish Allergy Anaphylaxis   Phenazopyridine Rash   Erythromycin Base Nausea Only   Sulfa Antibiotics Hives    Bactrim caused Hives   Tetanus Toxoids Swelling    As a child   Macrobid [Nitrofurantoin Macrocrystal] Other (See  Comments)    Caused fever? See  OV 10/21/2018   Pineapple Itching and Rash       PHYSICAL EXAMINATION: Vital signs: BP 124/79   Pulse 76   Temp 98.7 F (37.1 C)   Ht 5\' 7"  (1.702 m)   Wt 198 lb (89.8 kg)   LMP 06/30/2018 (Approximate)   SpO2 100%   BMI 31.01 kg/m  General: Well-developed, well-nourished, no acute distress HEENT: Sclerae are anicteric, conjunctiva pink. Oral mucosa intact Lungs: Clear Heart: Regular Abdomen: soft, nontender, nondistended, no obvious ascites, no peritoneal signs, normal bowel sounds. No organomegaly. Extremities: No edema Psychiatric: alert and oriented x3. Cooperative     ASSESSMENT:  Colon cancer screening   PLAN:  Screening colonoscopy

## 2022-05-03 NOTE — Progress Notes (Signed)
Pt resting comfortably. VSS. Airway intact. SBAR complete to RN. All questions answered.   

## 2022-05-03 NOTE — Progress Notes (Signed)
Called to room to assist during endoscopic procedure.  Patient ID and intended procedure confirmed with present staff. Received instructions for my participation in the procedure from the performing physician.  

## 2022-05-03 NOTE — Op Note (Addendum)
[facility] Patient Name: Maria Graves Procedure Date: 05/03/2022 9:34 AM MRN: [id number] Endoscopist: Docia Chuck. Henrene Pastor , MD, [id number] Age: 52 Referring MD:  Date of Birth: Oct 29, 1970 Gender: Female Account #: [id number] Procedure:                Colonoscopy with cold snare polypectomy x 1 Indications:              Screening in patient at increased risk: Colorectal                            cancer in mother 76 or older Medicines:                Monitored Anesthesia Care Procedure:                Pre-Anesthesia Assessment:                           - Prior to the procedure, a History and Physical                            was performed, and patient medications and                            allergies were reviewed. The patient's tolerance of                            previous anesthesia was also reviewed. The risks                            and benefits of the procedure and the sedation                            options and risks were discussed with the patient.                            All questions were answered, and informed consent                            was obtained. Prior Anticoagulants: The patient has                            taken no anticoagulant or antiplatelet agents. ASA                            Grade Assessment: II - A patient with mild systemic                            disease. After reviewing the risks and benefits,                            the patient was deemed in satisfactory condition to                            undergo the procedure.  After obtaining informed consent, the colonoscope                            was passed under direct vision. Throughout the                            procedure, the patient's blood pressure, pulse, and                            oxygen saturations were monitored continuously. The                            CF [id number] was introduced through the anus                             and advanced to the the cecum, identified by                            appendiceal orifice and ileocecal valve. The                            ileocecal valve, appendiceal orifice, and rectum                            were photographed. The quality of the bowel                            preparation was excellent. The colonoscopy was                            performed without difficulty. The patient tolerated                            the procedure well. The bowel preparation used was                            SUPREP via split dose instruction. Scope In: 9:47:23 AM Scope Out: 9:58:16 AM Scope Withdrawal Time: 0 hours 8 minutes 58 seconds  Total Procedure Duration: 0 hours 10 minutes 53 seconds  Findings:                 A 2 mm polyp was found in the descending colon. The                            polyp was sessile. The polyp was removed with a                            cold snare. Resection and retrieval were complete.                           Internal hemorrhoids were found during                            retroflexion. The hemorrhoids were moderate.  The exam was otherwise without abnormality on                            direct and retroflexion views. Complications:            No immediate complications. Estimated blood loss:                            None. Estimated Blood Loss:     Estimated blood loss: none. Impression:               - One 2 mm polyp in the descending colon, removed                            with a cold snare. Resected and retrieved.                           - Internal hemorrhoids.                           - The examination was otherwise normal on direct                            and retroflexion views. Recommendation:           - Repeat colonoscopy in 7-10 years for surveillance.                           - Patient has a contact number available for                            emergencies. The signs and symptoms of  potential                            delayed complications were discussed with the                            patient. Return to normal activities tomorrow.                            Written discharge instructions were provided to the                            patient.                           - Resume previous diet.                           - Continue present medications.                           - Await pathology results. Docia Chuck. Henrene Pastor, MD 05/03/2022 10:03:58 AM This report has been signed electronically.

## 2022-05-03 NOTE — Progress Notes (Signed)
Pt's states no medical or surgical changes since previsit or office visit. 

## 2022-05-04 ENCOUNTER — Telehealth: Payer: Self-pay | Admitting: *Deleted

## 2022-05-04 NOTE — Telephone Encounter (Signed)
Post procedure follow up phone call. No answer at number given.  Left message on voicemail.  

## 2022-05-09 ENCOUNTER — Encounter: Payer: Self-pay | Admitting: Internal Medicine

## 2022-07-29 ENCOUNTER — Other Ambulatory Visit: Payer: Self-pay | Admitting: Internal Medicine

## 2022-09-22 ENCOUNTER — Telehealth: Payer: 59 | Admitting: Nurse Practitioner

## 2022-09-22 DIAGNOSIS — R053 Chronic cough: Secondary | ICD-10-CM

## 2022-09-22 MED ORDER — BENZONATATE 100 MG PO CAPS: 100.0000 mg | ORAL_CAPSULE | Freq: Three times a day (TID) | ORAL | 0 refills | Status: DC | PRN

## 2022-09-22 MED ORDER — AZITHROMYCIN 250 MG PO TABS: ORAL_TABLET | ORAL | 0 refills | Status: DC

## 2022-09-22 NOTE — Progress Notes (Signed)
E-Visit for Cough  We are sorry that you are not feeling well.  Here is how we plan to help!  Based on your presentation I believe you most likely have A cough due to bacteria.  When patients have a fever and a productive cough with a change in color or increased sputum production, we are concerned about bacterial bronchitis.  If left untreated it can progress to pneumonia.  If your symptoms do not improve with your treatment plan it is important that you contact your provider.   I have prescribed Azithromyin 250 mg: two tablets now and then one tablet daily for 4 additonal days    In addition you may use A prescription cough medication called Tessalon Perles 100mg . You may take 1-2 capsules every 8 hours as needed for your cough.   From your responses in the eVisit questionnaire you describe inflammation in the upper respiratory tract which is causing a significant cough.  This is commonly called Bronchitis and has four common causes:   Allergies Viral Infections Acid Reflux Bacterial Infection Allergies, viruses and acid reflux are treated by controlling symptoms or eliminating the cause. An example might be a cough caused by taking certain blood pressure medications. You stop the cough by changing the medication. Another example might be a cough caused by acid reflux. Controlling the reflux helps control the cough.  USE OF BRONCHODILATOR ("RESCUE") INHALERS: There is a risk from using your bronchodilator too frequently.  The risk is that over-reliance on a medication which only relaxes the muscles surrounding the breathing tubes can reduce the effectiveness of medications prescribed to reduce swelling and congestion of the tubes themselves.  Although you feel brief relief from the bronchodilator inhaler, your asthma may actually be worsening with the tubes becoming more swollen and filled with mucus.  This can delay other crucial treatments, such as oral steroid medications. If you need to use a  bronchodilator inhaler daily, several times per day, you should discuss this with your provider.  There are probably better treatments that could be used to keep your asthma under control.     HOME CARE Only take medications as instructed by your medical team. Complete the entire course of an antibiotic. Drink plenty of fluids and get plenty of rest. Avoid close contacts especially the very young and the elderly Cover your mouth if you cough or cough into your sleeve. Always remember to wash your hands A steam or ultrasonic humidifier can help congestion.   GET HELP RIGHT AWAY IF: You develop worsening fever. You become short of breath You cough up blood. Your symptoms persist after you have completed your treatment plan MAKE SURE YOU  Understand these instructions. Will watch your condition. Will get help right away if you are not doing well or get worse.    Thank you for choosing an e-visit.  Your e-visit answers were reviewed by a board certified advanced clinical practitioner to complete your personal care plan. Depending upon the condition, your plan could have included both over the counter or prescription medications.  Please review your pharmacy choice. Make sure the pharmacy is open so you can pick up prescription now. If there is a problem, you may contact your provider through [employer] and have the prescription routed to another pharmacy.  Your safety is important to [place]. If you have drug allergies check your prescription carefully.   For the next 24 hours you can use MyChart to ask questions about today's visit, request a non-urgent  call back, or ask for a work or school excuse. You will get an email in the next two days asking about your experience. I hope that your e-visit has been valuable and will speed your recovery.  Mary-Margaret Daphine Deutscher, FNP   5-10 minutes spent reviewing and documenting in chart.

## 2022-10-23 DIAGNOSIS — N83209 Unspecified ovarian cyst, unspecified side: Secondary | ICD-10-CM

## 2022-10-23 HISTORY — PX: ENDOMETRIAL BIOPSY: SHX622

## 2022-10-23 HISTORY — DX: Unspecified ovarian cyst, unspecified side: N83.209

## 2022-10-25 ENCOUNTER — Telehealth: Payer: 59 | Admitting: Physician Assistant

## 2022-10-25 DIAGNOSIS — J208 Acute bronchitis due to other specified organisms: Secondary | ICD-10-CM

## 2022-10-25 DIAGNOSIS — B9689 Other specified bacterial agents as the cause of diseases classified elsewhere: Secondary | ICD-10-CM | POA: Diagnosis not present

## 2022-10-25 MED ORDER — ALBUTEROL SULFATE HFA 108 (90 BASE) MCG/ACT IN AERS
2.0000 | INHALATION_SPRAY | Freq: Four times a day (QID) | RESPIRATORY_TRACT | 0 refills | Status: DC | PRN
Start: 2022-10-25 — End: 2023-03-13

## 2022-10-25 MED ORDER — PROMETHAZINE-DM 6.25-15 MG/5ML PO SYRP
5.0000 mL | ORAL_SOLUTION | Freq: Four times a day (QID) | ORAL | 0 refills | Status: DC | PRN
Start: 2022-10-25 — End: 2023-03-04

## 2022-10-25 MED ORDER — AMOXICILLIN-POT CLAVULANATE 875-125 MG PO TABS: 1.0000 | ORAL_TABLET | Freq: Two times a day (BID) | ORAL | 0 refills | Status: DC

## 2022-10-25 NOTE — Progress Notes (Signed)
Virtual Visit Consent   Maria Graves, you are scheduled for a virtual visit with a [facility] provider today. Just as with appointments in the office, your consent must be obtained to participate. Your consent will be active for this visit and any virtual visit you may have with one of our providers in the next 365 days. If you have a MyChart account, a copy of this consent can be sent to you electronically.  As this is a virtual visit, video technology does not allow for your provider to perform a traditional examination. This may limit your provider's ability to fully assess your condition. If your provider identifies any concerns that need to be evaluated in person or the need to arrange testing (such as labs, EKG, etc.), we will make arrangements to do so. Although advances in technology are sophisticated, we cannot ensure that it will always work on either your end or our end. If the connection with a video visit is poor, the visit may have to be switched to a telephone visit. With either a video or telephone visit, we are not always able to ensure that we have a secure connection.  By engaging in this virtual visit, you consent to the provision of healthcare and authorize for your insurance to be billed (if applicable) for the services provided during this visit. Depending on your insurance coverage, you may receive a charge related to this service.  I need to obtain your verbal consent now. Are you willing to proceed with your visit today? Maria Graves has provided verbal consent on 10/25/2022 for a virtual visit (video or telephone). Piedad Climes, [place]  Date: 10/25/2022 9:49 AM  Virtual Visit via Video Note   I, Piedad Climes, connected with  Maria Graves  ([id number], [birth year]) on 10/25/22 at  9:45 AM EDT by a video-enabled telemedicine application and verified that I am speaking with the correct person using two identifiers.  Location: Patient: Virtual Visit  Location Patient: Home Provider: Virtual Visit Location Provider: Home Office   I discussed the limitations of evaluation and management by telemedicine and the availability of in person appointments. The patient expressed understanding and agreed to proceed.    History of Present Illness: Maria Graves is a 52 y.o. who identifies as a female who was assigned female at birth, and is being seen today for recurring URI symptoms over the past week with sore throat, nasal congestion, cough that was dry but now productive and associated wheezing and increased chest congestion. Denies fever. Initially with some chills.  Denies chest pain or SOB. Tested for COVID and was negative.   OTC -- Ibuprofen  HPI: HPI  Problems:  Patient Active Problem List   Diagnosis Date Noted   ASCUS of cervix with negative high risk HPV 07/27/2020   PCP NOTES >>> 11/16/2014   Right knee injury 06/25/2014   Multiple sclerosis (HCC) 10/01/2012   Annual physical exam 10/01/2012   Solitary pulmonary nodule 10/01/2012   HTN (hypertension) 10/01/2012    Allergies:  Allergies  Allergen Reactions   Shellfish Allergy Anaphylaxis   Phenazopyridine Rash   Erythromycin Base Nausea Only   Sulfa Antibiotics Hives    Bactrim caused Hives   Tetanus Toxoids Swelling    As a child   Macrobid [Nitrofurantoin Macrocrystal] Other (See Comments)    Caused fever? See  OV 10/21/2018   Pineapple Itching and Rash   Medications:  Current Outpatient Medications:    albuterol (VENTOLIN HFA)  108 (90 Base) MCG/ACT inhaler, Inhale 2 puffs into the lungs every 6 (six) hours as needed for wheezing or shortness of breath., Disp: 8 g, Rfl: 0   amoxicillin-clavulanate (AUGMENTIN) 875-125 MG tablet, Take 1 tablet by mouth 2 (two) times daily., Disp: 14 tablet, Rfl: 0   promethazine-dextromethorphan (PROMETHAZINE-DM) 6.25-15 MG/5ML syrup, Take 5 mLs by mouth 4 (four) times daily as needed for cough., Disp: 118 mL, Rfl: 0   Bacillus  Coagulans-Inulin (PROBIOTIC) 1-250 BILLION-MG CAPS, Take 1 capsule by mouth daily at 6 (six) AM., Disp: , Rfl:    baclofen (LIORESAL) 10 MG tablet, Take 10 mg by mouth daily as needed for muscle spasms., Disp: , Rfl:    Cholecalciferol (VITAMIN D3) 2000 units capsule, Take 4,000 Units by mouth daily., Disp: , Rfl:    famotidine (PEPCID) 10 MG tablet, Take 10 mg by mouth daily as needed for heartburn or indigestion., Disp: , Rfl:    lisinopril (ZESTRIL) 10 MG tablet, Take 2 tablets (20 mg total) by mouth daily., Disp: 180 tablet, Rfl: 1   Ocrelizumab (OCREVUS IV), Inject into the vein., Disp: , Rfl:    TURMERIC PO, Take 1 capsule by mouth 3 (three) times a week., Disp: , Rfl:   Observations/Objective: Patient is well-developed, well-nourished in no acute distress.  Resting comfortably at home.  Head is normocephalic, atraumatic.  No labored breathing. Speech is clear and coherent with logical content.  Patient is alert and oriented at baseline.   Assessment and Plan: 1. Acute bacterial bronchitis - albuterol (VENTOLIN HFA) 108 (90 Base) MCG/ACT inhaler; Inhale 2 puffs into the lungs every 6 (six) hours as needed for wheezing or shortness of breath.  Dispense: 8 g; Refill: 0 - promethazine-dextromethorphan (PROMETHAZINE-DM) 6.25-15 MG/5ML syrup; Take 5 mLs by mouth 4 (four) times daily as needed for cough.  Dispense: 118 mL; Refill: 0 - amoxicillin-clavulanate (AUGMENTIN) 875-125 MG tablet; Take 1 tablet by mouth 2 (two) times daily.  Dispense: 14 tablet; Refill: 0  Rx Augmentin.  Increase fluids.  Rest.  Saline nasal spray.  Probiotic.  Mucinex as directed.  Humidifier in bedroom. Albuterol and promethazine-dm per orders.  Call or return to clinic if symptoms are not improving.   Follow Up Instructions: I discussed the assessment and treatment plan with the patient. The patient was provided an opportunity to ask questions and all were answered. The patient agreed with the plan and  demonstrated an understanding of the instructions.  A copy of instructions were sent to the patient via MyChart unless otherwise noted below.   The patient was advised to call back or seek an in-person evaluation if the symptoms worsen or if the condition fails to improve as anticipated.  Time:  I spent 10 minutes with the patient via telehealth technology discussing the above problems/concerns.    Piedad Climes, PA-C

## 2022-10-25 NOTE — Patient Instructions (Addendum)
Albertine Grates [place], thank you for joining Piedad Climes, PA-C for today's virtual visit.  While this provider is not your primary care provider (PCP), if your PCP is located in our provider database this encounter information will be shared with them immediately following your visit.   A [facility] MyChart account gives you access to today's visit and all your visits, tests, and labs performed at [facility] " click here if you don't have a [facility] MyChart account or go to [email or web address]  Consent: (Patient) Maria Graves provided verbal consent for this virtual visit at the beginning of the encounter.  Current Medications:  Current Outpatient Medications:    azithromycin (ZITHROMAX Z-PAK) 250 MG tablet, As directed, Disp: 6 tablet, Rfl: 0   Bacillus Coagulans-Inulin (PROBIOTIC) 1-250 BILLION-MG CAPS, Take 1 capsule by mouth daily at 6 (six) AM., Disp: , Rfl:    baclofen (LIORESAL) 10 MG tablet, Take 10 mg by mouth daily as needed for muscle spasms., Disp: , Rfl:    benzonatate (TESSALON PERLES) 100 MG capsule, Take 1 capsule (100 mg total) by mouth 3 (three) times daily as needed for cough., Disp: 20 capsule, Rfl: 0   Cholecalciferol (VITAMIN D3) 2000 units capsule, Take 4,000 Units by mouth daily., Disp: , Rfl:    famotidine (PEPCID) 10 MG tablet, Take 10 mg by mouth daily as needed for heartburn or indigestion., Disp: , Rfl:    lisinopril (ZESTRIL) 10 MG tablet, Take 2 tablets (20 mg total) by mouth daily., Disp: 180 tablet, Rfl: 1   Ocrelizumab (OCREVUS IV), Inject into the vein., Disp: , Rfl:    TURMERIC PO, Take 1 capsule by mouth 3 (three) times a week., Disp: , Rfl:    Medications ordered in this encounter:  No orders of the defined types were placed in this encounter.    *If you need refills on other medications prior to your next appointment, please contact your pharmacy*  Follow-Up: Call back or seek an in-person evaluation if the symptoms  worsen or if the condition fails to improve as anticipated.  [facility] [phone]  Other Instructions Take antibiotic (Augmentin) as directed.  Increase fluids.  Get plenty of rest. Use Mucinex for congestion. Take all prescribed medications as directed. Take a daily probiotic (I recommend Align or Culturelle, but even Activia Yogurt may be beneficial).  A humidifier placed in the bedroom may offer some relief for a dry, scratchy throat of nasal irritation.  Read information below on acute bronchitis. Please call or return to clinic if symptoms are not improving.  Acute Bronchitis Bronchitis is when the airways that extend from the windpipe into the lungs get red, puffy, and painful (inflamed). Bronchitis often causes thick spit (mucus) to develop. This leads to a cough. A cough is the most common symptom of bronchitis. In acute bronchitis, the condition usually begins suddenly and goes away over time (usually in 2 weeks). Smoking, allergies, and asthma can make bronchitis worse. Repeated episodes of bronchitis may cause more lung problems.  HOME CARE Rest. Drink enough fluids to keep your pee (urine) clear or pale yellow (unless you need to limit fluids as told by your doctor). Only take over-the-counter or prescription medicines as told by your doctor. Avoid smoking and secondhand smoke. These can make bronchitis worse. If you are a smoker, think about using nicotine gum or skin patches. Quitting smoking will help your lungs heal faster. Reduce the chance of getting bronchitis again by: Washing your hands  often. Avoiding people with cold symptoms. Trying not to touch your hands to your mouth, nose, or eyes. Follow up with your doctor as told.  GET HELP IF: Your symptoms do not improve after 1 week of treatment. Symptoms include: Cough. Fever. Coughing up thick spit. Body aches. Chest congestion. Chills. Shortness of breath. Sore throat.  GET HELP RIGHT AWAY IF:   You have an increased fever. You have chills. You have severe shortness of breath. You have bloody thick spit (sputum). You throw up (vomit) often. You lose too much body fluid (dehydration). You have a severe headache. You faint.  MAKE SURE YOU:  Understand these instructions. Will watch your condition. Will get help right away if you are not doing well or get worse. Document Released: 07/04/2007 Document Revised: 09/17/2012 Document Reviewed: 07/08/2012 [facility] Patient Information 2015 [place], [place]. This information is not intended to replace advice given to you by your health care provider. Make sure you discuss any questions you have with your health care provider.    If you have been instructed to have an in-person evaluation today at a local Urgent Care facility, please use the link below. It will take you to a list of all of our available [facility], including address, phone number and hours of operation. Please do not delay care.  [facility]  If you or a family member do not have a primary care provider, use the link below to schedule a visit and establish care. When you choose a [facility] primary care physician or advanced practice provider, you gain a long-term partner in health. Find a Primary Care Provider  Learn more about 's in-office and virtual care options:  - Get Care Now

## 2022-10-29 ENCOUNTER — Ambulatory Visit (HOSPITAL_BASED_OUTPATIENT_CLINIC_OR_DEPARTMENT_OTHER)
Admission: RE | Admit: 2022-10-29 | Discharge: 2022-10-29 | Disposition: A | Discharge status: Home / Self Care | Payer: 59 | Source: Ambulatory Visit | Attending: Family | Admitting: Family

## 2022-10-29 ENCOUNTER — Ambulatory Visit: Payer: 59 | Admitting: Family

## 2022-10-29 VITALS — BP 148/64 | HR 74 | Temp 98.2°F | Resp 18 | Wt 194.0 lb

## 2022-10-29 DIAGNOSIS — J209 Acute bronchitis, unspecified: Secondary | ICD-10-CM | POA: Diagnosis not present

## 2022-10-29 DIAGNOSIS — R062 Wheezing: Secondary | ICD-10-CM | POA: Insufficient documentation

## 2022-10-29 MED ORDER — PREDNISONE 10 MG PO TABS: ORAL_TABLET | ORAL | 0 refills | Status: DC

## 2022-10-29 NOTE — Progress Notes (Signed)
Subjective:     Patient ID: Maria Graves, female    DOB: 1970-12-21, 52 y.o.   MRN: [id number]  Chief Complaint  Patient presents with   Fatigue    Complains of fatigue since last week   Cough    Patient complains of cough   Dizziness    Complains of dizziness    HPI  Discussed the use of AI scribe software for clinical note transcription with the patient, who gave verbal consent to proceed.  History of Present Illness   The patient, with a history of COVID-19 infection in August, presents with a recurrence of respiratory symptoms. She initially developed an upper respiratory infection post-COVID, which was successfully treated with a Z-Pak. However, a few weeks later, she began to experience fatigue and developed a cough and nasal congestion after exposure to mold. Her symptoms progressed to include significant nasal drainage and altered breathing, described as 'crumbling and cracking in my chest.' She was prescribed augmentin on 9/26 via an E-visit, which initially seemed to improve her symptoms, but they subsequently worsened. She now experiences significant dyspnea on exertion, with a heart rate over 100 after minimal activity. She also reports dizziness with exertion. A recent home COVID-19 test was negative. She also mentions a swelling and pain in the right upper quadrant of the abdomen, which she suspects may be due to a pulled muscle from coughing.          Health Maintenance Due  Topic Date Due   Zoster Vaccines- Shingrix (1 of 2) Never done   INFLUENZA VACCINE  Never done   MAMMOGRAM  09/26/2022   COVID-19 Vaccine (3 - 2023-24 season) 09/30/2022    Past Medical History:  Diagnosis Date   Arthritis    bilateral hands   Contraception    husband vasectomy   Essential tremor    Gestational diabetes    HTN (hypertension)    on meds   MS (multiple sclerosis) (HCC)    Proteinuria    Reports mild, chronic proteinuria    Past Surgical History:  Procedure  Laterality Date   TONSILLECTOMY  1989   WISDOM TOOTH EXTRACTION      Family History  Problem Relation Age of Onset   Colon cancer Mother 11   Colon polyps Mother 19   Arthritis Mother    Alcohol abuse Mother    Obesity Mother    COPD Mother    Depression Mother    Diabetes Mother    Hyperlipidemia Mother    Cardiomyopathy Mother    Atrial fibrillation Mother    Bladder Cancer Mother 20   Diabetes Father    Clotting disorder Father    Kidney disease Father    Obesity Father    Heart disease Father 61       on dyalisis   Hyperlipidemia Father    Hypertension Father    Atrial fibrillation Father    Hypertension Brother    Diabetes Maternal Grandfather    Stroke Paternal Grandmother    Heart disease Paternal Grandfather    Heart attack Paternal Grandfather    Sudden death Other        uncle, GF   Breast cancer Neg Hx    Esophageal cancer Neg Hx    Stomach cancer Neg Hx    Rectal cancer Neg Hx     Social History   Socioeconomic History   Marital status: Married    Spouse name: Not on file   Number of children:  2   Years of education: Not on file   Highest education level: Not on file  Occupational History   Occupation: pt is a Investment banker, 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   Tobacco Use   Smoking status: Every Day    Current packs/day: 0.50    Average packs/day: 0.5 packs/day for 20.0 years (10.0 ttl pk-yrs)    Types: Cigarettes   Smokeless tobacco: Never   Tobacco comments:    05/03/22 - "Quit 3 days ago"  Vaping Use   Vaping status: Never Used  Substance and Sexual Activity   Alcohol use: Yes    Alcohol/week: 0.0 standard drinks of alcohol    Comment: Socially   Drug use: No   Sexual activity: Yes    Partners: Male    Comment: 1st intercourse- 54, partners- , married- 22 yrs   Other Topics Concern   Not on file  Social History Narrative   Moved from [place] July 2014   G3P2   Son Forensic scientist , doing a Child psychotherapist    2 children :college    Social Determinants of  Health   Financial Resource Strain: Low Risk  (10/29/2022)   Overall Financial Resource Strain (CARDIA)    Difficulty of Paying Living Expenses: Not hard at all  Food Insecurity: No Food Insecurity (10/29/2022)   Hunger Vital Sign    Worried About Running Out of Food in the Last Year: Never true    Ran Out of Food in the Last Year: Never true  Transportation Needs: Not on file  Physical Activity: Unknown (10/29/2022)   Exercise Vital Sign    Days of Exercise per Week: 2 days    Minutes of Exercise per Session: Not on file  Stress: No Stress Concern Present (10/29/2022)   [employer] of Occupational Health - Occupational Stress Questionnaire    Feeling of Stress : Only a little  Social Connections: Unknown (10/29/2022)   Social Connection and Isolation Panel [NHANES]    Frequency of Communication with Friends and Family: More than three times a week    Frequency of Social Gatherings with Friends and Family: More than three times a week    Attends Religious Services: Not on Marketing executive or Organizations: Yes    Attends Banker Meetings: Not on file    Marital Status: Married  Catering manager Violence: Not on file    Outpatient Medications Prior to Visit  Medication Sig Dispense Refill   albuterol (VENTOLIN HFA) 108 (90 Base) MCG/ACT inhaler Inhale 2 puffs into the lungs every 6 (six) hours as needed for wheezing or shortness of breath. 8 g 0   amoxicillin-clavulanate (AUGMENTIN) 875-125 MG tablet Take 1 tablet by mouth 2 (two) times daily. 14 tablet 0   Bacillus Coagulans-Inulin (PROBIOTIC) 1-250 BILLION-MG CAPS Take 1 capsule by mouth daily at 6 (six) AM.     baclofen (LIORESAL) 10 MG tablet Take 10 mg by mouth daily as needed for muscle spasms.     Cholecalciferol (VITAMIN D3) 2000 units capsule Take 4,000 Units by mouth daily.     famotidine (PEPCID) 10 MG tablet Take 10 mg by mouth daily as needed for heartburn or indigestion.     lisinopril  (ZESTRIL) 10 MG tablet Take 2 tablets (20 mg total) by mouth daily. 180 tablet 1   Ocrelizumab (OCREVUS IV) Inject into the vein.     promethazine-dextromethorphan (PROMETHAZINE-DM) 6.25-15 MG/5ML syrup Take 5 mLs by mouth 4 (four) times daily  as needed for cough. 118 mL 0   TURMERIC PO Take 1 capsule by mouth 3 (three) times a week.     No facility-administered medications prior to visit.    Allergies  Allergen Reactions   Shellfish Allergy Anaphylaxis   Phenazopyridine Rash   Erythromycin Base Nausea Only   Sulfa Antibiotics Hives    Bactrim caused Hives   Tetanus Toxoids Swelling    As a child   Macrobid [Nitrofurantoin Macrocrystal] Other (See Comments)    Caused fever? See  OV 10/21/2018   Pineapple Itching and Rash    ROS See HPI    Objective:    Physical Exam HENT:     Right Ear: Tympanic membrane and ear canal normal.     Left Ear: Tympanic membrane and ear canal normal.     Mouth/Throat:     Mouth: Mucous membranes are moist.     Pharynx: No pharyngeal swelling, oropharyngeal exudate or posterior oropharyngeal erythema.     Tonsils: 2+ on the right. 2+ on the left.  Cardiovascular:     Rate and Rhythm: Normal rate and regular rhythm.     Heart sounds: No murmur heard. Pulmonary:     Effort: Pulmonary effort is normal.     Breath sounds: Normal air entry. Examination of the right-middle field reveals wheezing. Examination of the left-middle field reveals wheezing. Examination of the right-lower field reveals wheezing. Examination of the left-lower field reveals wheezing. Wheezing and rhonchi (scattered) present.  Abdominal:     Tenderness: There is no abdominal tenderness. There is no guarding.     Hernia: No hernia is present.     Comments: No abdominal mass  Lymphadenopathy:     Cervical: No cervical adenopathy.      BP (!) 148/64 (BP Location: Right Arm, Patient Position: Sitting, Cuff Size: Small)   Pulse 74   Temp 98.2 F (36.8 C) (Oral)   Resp 18    Wt 194 lb (88 kg)   LMP 06/30/2018 (Approximate)   SpO2 100%   BMI 30.38 kg/m  Wt Readings from Last 3 Encounters:  10/29/22 194 lb (88 kg)  05/03/22 198 lb (89.8 kg)  04/04/22 198 lb (89.8 kg)       Assessment & Plan:   Problem List Items Addressed This Visit       Unprioritized   Bronchitis with bronchospasm - Primary    New.  Hx of intermittent ongoing smoking, denies hx of asthma. May have an underlying component of COPD. Oxygen saturation is 100%. Will rx with prednisone taper. Continue albuterol, 2 puffs every 6 hours until symptoms improve then prn.  CXR negative for PNA, may continue augmentin and prn Promethazine DM.   Abdominal discomfort is likely due to musculoskeletal pain in the setting of cough.  Pt is advised to let us now if this does not improve as her cough improves.  Call if symptoms worsen or if symptoms do not improve in the next few days.        Relevant Orders   DG Chest 2 View (Completed)    I am having Sue Lush L. Iser start on predniSONE. I am also having her maintain her Vitamin D3, Ocrelizumab (OCREVUS IV), famotidine, Probiotic, TURMERIC PO, baclofen, lisinopril, albuterol, promethazine-dextromethorphan, and amoxicillin-clavulanate.  Meds ordered this encounter  Medications   predniSONE (DELTASONE) 10 MG tablet    Sig: 4 tabs by mouth once daily for 2 days, then 3 tabs daily x 2 days, then 2 tabs daily x  2 days, then 1 tab daily x 2 days    Dispense:  20 tablet    Refill:  0    Order Specific Question:   Supervising Provider    Answer:   Danise Edge A [[id number]]

## 2022-10-29 NOTE — Patient Instructions (Signed)
VISIT SUMMARY:  During your visit, we discussed your ongoing respiratory symptoms and abdominal pain. Your symptoms include a persistent cough, wheezing, shortness of breath, and fatigue, which have worsened after exposure to mold. You also reported swelling and pain in the right upper part of your abdomen, which you believe may be due to a muscle strain from coughing. We have outlined a plan to address these issues and improve your health.  YOUR PLAN:  -Bronchitis with wheezing: This is a condition where you have a persistent cough, wheezing, and shortness of breath following a recent upper respiratory infection. We will order a chest x-ray to check for pneumonia, a lung infection that can cause these symptoms. We will also prescribe prednisone, a medication that can reduce inflammation and improve breathing. You should continue using your albuterol inhaler every six hours for the next few days. We will check the results of your chest x-ray and adjust your treatment plan if necessary.  -ABDOMINAL WALL PAIN: This is a condition where you have intermittent swelling and pain in the right upper part of your abdomen, likely due to muscle strain from coughing. We recommend over-the-counter pain relief with Tylenol. If the pain does not improve in 1-2 weeks, we may consider an ultrasound to further investigate.  INSTRUCTIONS:  Please get a chest x-ray as soon as possible to check for pneumonia. Start taking prednisone as prescribed and continue using your albuterol inhaler every six hours for the next few days. If your abdominal pain does not improve with over-the-counter Tylenol in 1-2 weeks, please contact us to consider further investigation with an ultrasound.

## 2022-10-29 NOTE — Assessment & Plan Note (Addendum)
New.  Hx of intermittent ongoing smoking, denies hx of asthma. May have an underlying component of COPD. Oxygen saturation is 100%. Will rx with prednisone taper. Continue albuterol, 2 puffs every 6 hours until symptoms improve then prn.  CXR negative for PNA, may continue augmentin and prn Promethazine DM.   Abdominal discomfort is likely due to musculoskeletal pain in the setting of cough.  Pt is advised to let us now if this does not improve as her cough improves.  Call if symptoms worsen or if symptoms do not improve in the next few days.

## 2022-11-19 ENCOUNTER — Ambulatory Visit: Payer: 59 | Admitting: Internal Medicine

## 2022-11-26 ENCOUNTER — Ambulatory Visit: Payer: 59 | Admitting: Internal Medicine

## 2023-01-26 ENCOUNTER — Other Ambulatory Visit: Payer: Self-pay | Admitting: Internal Medicine

## 2023-02-26 ENCOUNTER — Encounter: Payer: 59 | Admitting: Internal Medicine

## 2023-03-04 ENCOUNTER — Ambulatory Visit (INDEPENDENT_AMBULATORY_CARE_PROVIDER_SITE_OTHER): Payer: 59 | Admitting: Internal Medicine

## 2023-03-04 ENCOUNTER — Encounter: Payer: Self-pay | Admitting: Internal Medicine

## 2023-03-04 VITALS — BP 130/80 | HR 64 | Temp 97.8°F | Resp 18 | Ht 66.0 in | Wt 214.5 lb

## 2023-03-04 DIAGNOSIS — R109 Unspecified abdominal pain: Secondary | ICD-10-CM | POA: Diagnosis not present

## 2023-03-04 DIAGNOSIS — Z0000 Encounter for general adult medical examination without abnormal findings: Secondary | ICD-10-CM

## 2023-03-04 DIAGNOSIS — G35 Multiple sclerosis: Secondary | ICD-10-CM | POA: Diagnosis not present

## 2023-03-04 DIAGNOSIS — E559 Vitamin D deficiency, unspecified: Secondary | ICD-10-CM

## 2023-03-04 DIAGNOSIS — K219 Gastro-esophageal reflux disease without esophagitis: Secondary | ICD-10-CM

## 2023-03-04 DIAGNOSIS — R739 Hyperglycemia, unspecified: Secondary | ICD-10-CM | POA: Diagnosis not present

## 2023-03-04 DIAGNOSIS — G35D Multiple sclerosis, unspecified: Secondary | ICD-10-CM

## 2023-03-04 DIAGNOSIS — I10 Essential (primary) hypertension: Secondary | ICD-10-CM | POA: Diagnosis not present

## 2023-03-04 DIAGNOSIS — R8271 Bacteriuria: Secondary | ICD-10-CM

## 2023-03-04 DIAGNOSIS — K8020 Calculus of gallbladder without cholecystitis without obstruction: Secondary | ICD-10-CM

## 2023-03-04 DIAGNOSIS — Z0001 Encounter for general adult medical examination with abnormal findings: Secondary | ICD-10-CM

## 2023-03-04 MED ORDER — PANTOPRAZOLE SODIUM 40 MG PO TBEC: 40.0000 mg | DELAYED_RELEASE_TABLET | Freq: Every day | ORAL | 1 refills | Status: DC

## 2023-03-04 NOTE — Patient Instructions (Addendum)
For heartburn: - Stop famotidine - Start pantoprazole 40 mg 1 tablet in the morning on empty stomach    GO TO THE LAB : Get the blood work     Next visit with me in 4 months for a checkup    Please schedule it at the front desk    STOP BY THE FIRST FLOOR: Arrange for ultrasound of your abdomen

## 2023-03-04 NOTE — Progress Notes (Unsigned)
Subjective:    Patient ID: Maria Graves, female    DOB: Nov 08, 1970, 53 y.o.   MRN: [id number]  DOS:  03/04/2023 Type of visit - description: CPX, here with her husband  In general feels well. Quit tobacco 09-2022, + weight gain since then. Also her previously well-controlled GERD has resurfaced, mostly  at night and  associated sometimes with nausea for few hours and had at least 2 episodes of RUQ abdominal pain that lasted few hours. Denies vomiting, diarrhea, no blood in the stools. No LUTS.  Review of Systems  Other than above, a 14 point review of systems is negative       Past Medical History:  Diagnosis Date   Arthritis    bilateral hands   Contraception    husband vasectomy   Essential tremor    Gestational diabetes    HTN (hypertension)    on meds   MS (multiple sclerosis) (HCC)    Proteinuria    Reports mild, chronic proteinuria    Past Surgical History:  Procedure Laterality Date   TONSILLECTOMY  1989   WISDOM TOOTH EXTRACTION     Social History   Socioeconomic History   Marital status: Married    Spouse name: Not on file   Number of children: 2   Years of education: Not on file   Highest education level: Not on file  Occupational History   Occupation: pt is a Investment banker, 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   Tobacco Use   Smoking status: Every Day    Current packs/day: 0.50    Average packs/day: 0.5 packs/day for 20.0 years (10.0 ttl pk-yrs)    Types: Cigarettes   Smokeless tobacco: Never   Tobacco comments:    Quit tobacco 09-2022  Vaping Use   Vaping status: Never Used  Substance and Sexual Activity   Alcohol use: Yes    Alcohol/week: 0.0 standard drinks of alcohol    Comment: Socially   Drug use: No   Sexual activity: Yes    Partners: Male    Comment: 1st intercourse- 84, partners- , married- 22 yrs   Other Topics Concern   Not on file  Social History Narrative   Moved from [place] July 2014   G3P2   Son Forensic scientist   Son goes to [employer]      2 children :college    Social Drivers of Corporate investment banker Strain: Low Risk  (10/29/2022)   Overall Financial Resource Strain (CARDIA)    Difficulty of Paying Living Expenses: Not hard at all  Food Insecurity: No Food Insecurity (10/29/2022)   Hunger Vital Sign    Worried About Running Out of Food in the Last Year: Never true    Ran Out of Food in the Last Year: Never true  Transportation Needs: Not on file  Physical Activity: Unknown (10/29/2022)   Exercise Vital Sign    Days of Exercise per Week: 2 days    Minutes of Exercise per Session: Not on file  Stress: No Stress Concern Present (10/29/2022)   [employer] of Occupational Health - Occupational Stress Questionnaire    Feeling of Stress : Only a little  Social Connections: Unknown (10/29/2022)   Social Connection and Isolation Panel [NHANES]    Frequency of Communication with Friends and Family: More than three times a week    Frequency of Social Gatherings with Friends and Family: More than three times a week    Attends Religious Services: Not on  file    Active Member of Clubs or Organizations: Yes    Attends Banker Meetings: Not on file    Marital Status: Married  Catering manager Violence: Not on file     Current Outpatient Medications  Medication Instructions   albuterol (VENTOLIN HFA) 108 (90 Base) MCG/ACT inhaler 2 puffs, Inhalation, Every 6 hours PRN   Bacillus Coagulans-Inulin (PROBIOTIC) 1-250 BILLION-MG CAPS 1 capsule, Daily   baclofen (LIORESAL) 10 mg, Daily PRN   lisinopril (ZESTRIL) 20 mg, Oral, Daily   Ocrelizumab (OCREVUS IV) Inject into the vein.   pantoprazole (PROTONIX) 40 mg, Oral, Daily before breakfast   Vitamin D3 4,000 Units, Daily       Objective:   Physical Exam BP 130/80   Pulse 64   Temp 97.8 F (36.6 C) (Oral)   Resp 18   Ht 5\' 6"  (1.676 m)   Wt 214 lb 8 oz (97.3 kg)   LMP 06/30/2018 (Approximate)   SpO2 96%   BMI 34.62 kg/m  General:   Well  developed, NAD, BMI noted.  HEENT:  Normocephalic . Face symmetric, atraumatic Lungs:  CTA B Normal respiratory effort, no intercostal retractions, no accessory muscle use. Heart: RRR,  no murmur.  Abdomen:  Not distended, soft, minimal if any tenderness at the RUQ and lower abdomen mostly on the right side.  No mass, no rebound. Skin: Not pale. Not jaundice Lower extremities: no pretibial edema bilaterally  Neurologic:  alert & oriented X3.  Speech normal, gait appropriate for age and unassisted Psych--  Cognition and judgment appear intact.  Cooperative with normal attention span and concentration.  Behavior appropriate. No anxious or depressed appearing.     Assessment     Assessment A1C 5.8 Multiple sclerosis: neuro @ [facility].  DX 2003 Essential tremor HTN Pulmonary nodule, last CT 05-2015, stable, no further scans Gestational diabetes Proteinuria: Mild, chronic Menopausal ~ 2021 Contraception: Husband vasectomy  PLAN: Here for CPX -Td: intolerant, ++ swelling - PNM, Shingrix, COVID, flu shot: Hesitant to proceed due to history of MS --Female care:  sees  Gyn.  Pap smear 06-2020.  MMG 2024 per pt, plans to proceed with follow-up MMG -- CCS: cologuard - 01/06/2019; subsequently there was a question of colon cancer in mother, had a colonoscopy 05/03/2022, next per GI. -- Bones : LMP 2021, on Vit D ; FH osteoporosis: G-mother ; consider dexa 2026. -Labs: BMP A1c FLP UA vitamin D --Tobacco: Quit tobacco 09-2022. Praised!! --lifestyle: Trying to go back on a routine exercise. We also discussed other issues: MS: LOV with neurology 10/09/2022, continue Ocrevus, follow-up MRI was recommended for December 2025 HTN: On lisinopril, check a BMP.   Hyperglycemia: Check A1c, diet exercise discussed Vitamin D deficiency: On supplements, checking labs GERD, RUQ abdominal pain: Since she quit tobacco and gained  weight is having more acid reflux than before and abdominal pain.  Plan: Switch  famotidine to pantoprazole, check ultrasound, r/o gallbladder problems. RTC 4 months

## 2023-03-05 ENCOUNTER — Ambulatory Visit (HOSPITAL_BASED_OUTPATIENT_CLINIC_OR_DEPARTMENT_OTHER)
Admission: RE | Admit: 2023-03-05 | Discharge: 2023-03-05 | Disposition: A | Discharge status: Home / Self Care | Payer: 59 | Source: Ambulatory Visit | Attending: Internal Medicine | Admitting: Internal Medicine

## 2023-03-05 ENCOUNTER — Encounter: Payer: Self-pay | Admitting: Internal Medicine

## 2023-03-05 DIAGNOSIS — K219 Gastro-esophageal reflux disease without esophagitis: Secondary | ICD-10-CM | POA: Diagnosis present

## 2023-03-05 DIAGNOSIS — R109 Unspecified abdominal pain: Secondary | ICD-10-CM | POA: Diagnosis present

## 2023-03-05 LAB — URINALYSIS, ROUTINE W REFLEX MICROSCOPIC
Bilirubin Urine: NEGATIVE
Ketones, ur: NEGATIVE
Nitrite: NEGATIVE
Specific Gravity, Urine: 1.015 (ref 1.000–1.030)
Total Protein, Urine: NEGATIVE
Urine Glucose: NEGATIVE
Urobilinogen, UA: 0.2 (ref 0.0–1.0)
pH: 8 (ref 5.0–8.0)

## 2023-03-05 LAB — LIPID PANEL
Cholesterol: 190 mg/dL (ref 0–200)
HDL: 59.2 mg/dL (ref >39.00)
LDL Cholesterol: 85 mg/dL (ref 0–99)
NonHDL: 131.22
Total CHOL/HDL Ratio: 3
Triglycerides: 233 mg/dL — ABNORMAL HIGH (ref 0.0–149.0)
VLDL: 46.6 mg/dL — ABNORMAL HIGH (ref 0.0–40.0)

## 2023-03-05 LAB — BASIC METABOLIC PANEL
BUN: 17 mg/dL (ref 6–23)
CO2: 29 meq/L (ref 19–32)
Calcium: 9.2 mg/dL (ref 8.4–10.5)
Chloride: 99 meq/L (ref 96–112)
Creatinine, Ser: 0.7 mg/dL (ref 0.40–1.20)
GFR: 97.69 mL/min (ref >60.00)
Glucose, Bld: 87 mg/dL (ref 70–99)
Potassium: 4.5 meq/L (ref 3.5–5.1)
Sodium: 140 meq/L (ref 135–145)

## 2023-03-05 LAB — VITAMIN D 25 HYDROXY (VIT D DEFICIENCY, FRACTURES): VITD: 76.66 ng/mL (ref 30.00–100.00)

## 2023-03-05 LAB — HEMOGLOBIN A1C: Hgb A1c MFr Bld: 6 % (ref 4.6–6.5)

## 2023-03-05 NOTE — Addendum Note (Signed)
Addended by: Conrad Linden D on: 03/05/2023 04:38 PM   Modules accepted: Orders

## 2023-03-05 NOTE — Addendum Note (Signed)
Addended byConrad Camuy D on: 03/05/2023 01:50 PM   Modules accepted: Orders

## 2023-03-05 NOTE — Assessment & Plan Note (Signed)
 Here for CPX  We also discussed other issues: MS: LOV with neurology 10/09/2022, continue Ocrevus, follow-up MRI was recommended for December 2025 HTN: On lisinopril , check a BMP.   Hyperglycemia: Check A1c, diet exercise discussed Vitamin D  deficiency: On supplements, checking labs GERD, RUQ abdominal pain: Since she quit tobacco and gained  weight is having more acid reflux than before and abdominal pain.  Plan: Switch famotidine to pantoprazole , check ultrasound, r/o gallbladder problems. RTC 4 months

## 2023-03-05 NOTE — Assessment & Plan Note (Signed)
 Here for CPX -Td: intolerant, ++ swelling - PNM, Shingrix, COVID, flu shot: Hesitant to proceed due to history of MS --Female care:  sees  Gyn.  Pap smear 06-2020.  MMG 2024 per pt, plans to proceed with follow-up MMG -- CCS: cologuard - 01/06/2019; subsequently there was a question of colon cancer in mother, had a colonoscopy 05/03/2022, next per GI. -- Bones : LMP 2021, on Vit D ; FH osteoporosis: G-mother ; consider dexa 2026. -Labs: BMP A1c FLP UA vitamin D  --Tobacco: Quit tobacco 09-2022. Praised!! --lifestyle: Trying to go back on a routine exercise.

## 2023-03-06 ENCOUNTER — Other Ambulatory Visit (INDEPENDENT_AMBULATORY_CARE_PROVIDER_SITE_OTHER): Payer: 59

## 2023-03-06 DIAGNOSIS — R8271 Bacteriuria: Secondary | ICD-10-CM | POA: Diagnosis not present

## 2023-03-06 LAB — URINALYSIS, ROUTINE W REFLEX MICROSCOPIC
Bilirubin Urine: NEGATIVE
Ketones, ur: NEGATIVE
Nitrite: NEGATIVE
Specific Gravity, Urine: 1.025 (ref 1.000–1.030)
Total Protein, Urine: NEGATIVE
Urine Glucose: NEGATIVE
Urobilinogen, UA: 0.2 (ref 0.0–1.0)
pH: 5.5 (ref 5.0–8.0)

## 2023-03-06 NOTE — Progress Notes (Signed)
 Urine specimen provided.

## 2023-03-07 LAB — URINE CULTURE
MICRO NUMBER:: 16044907
SPECIMEN QUALITY:: ADEQUATE

## 2023-03-09 ENCOUNTER — Other Ambulatory Visit (HOSPITAL_BASED_OUTPATIENT_CLINIC_OR_DEPARTMENT_OTHER): Payer: 59

## 2023-03-10 ENCOUNTER — Encounter: Payer: Self-pay | Admitting: Internal Medicine

## 2023-03-11 MED ORDER — CEPHALEXIN 500 MG PO CAPS: 500.0000 mg | ORAL_CAPSULE | Freq: Four times a day (QID) | ORAL | 0 refills | Status: DC

## 2023-03-13 ENCOUNTER — Ambulatory Visit: Payer: Self-pay | Admitting: Surgery

## 2023-03-13 NOTE — H&P (Signed)
History of Present Illness: Maria Graves is a 53 y.o. female who is seen today as an office consultation for evaluation of gallstones.  About a month ago she began having pain in the right upper quadrant.  It is associated with nausea but no vomiting.  It now occurs every time she eats, especially foods with any grease.  She is completely cut out dairy and meat from her diet, which has helped but she continues to have frequent symptoms.  She had a RUQ [place] on 2/4 which showed cholelithiasis.  She had LFTs 2 weeks ago which were normal.   She has not had any prior abdominal surgeries.  She has multiple sclerosis and is on Ocrevus infusions, which she gets every 6 months (she just had her most recent infusion last month).       Review of Systems: A complete review of systems was obtained from the patient.  I have reviewed this information and discussed as appropriate with the patient.  See HPI as well for other ROS.     Medical History: Past Medical History Past Medical History: Diagnosis Date  Arthritis    GERD (gastroesophageal reflux disease)         Problem List There is no problem list on file for this patient.     Past Surgical History History reviewed. No pertinent surgical history.     Allergies Allergies Allergen Reactions  Phenazopyridine Rash  Erythromycin Base Nausea  Shellfish Containing Products Swelling  Sulfamethoxazole Hives  Tetanus Toxoid, Adsorbed Swelling     As a child  Nitrofurantoin Macrocrystal Other (See Comments)     Caused fever? See  OV 10/21/2018  Pineapple Itching and Rash      Medications Ordered Prior to Encounter Current Outpatient Medications on File Prior to Visit Medication Sig Dispense Refill  cholecalciferol 1000 unit tablet Take by mouth      L.acid/B.animalis,bifidum/FOS (PROBIOTIC COMPLEX ORAL) Take by mouth      lisinopriL (ZESTRIL) 20 MG tablet Take 20 mg by mouth once daily      pantoprazole (PROTONIX) 40 MG DR tablet Take 40  mg by mouth        No current facility-administered medications on file prior to visit.      Family History Family History Problem Relation Age of Onset  Skin cancer Mother    Obesity Mother    High blood pressure (Hypertension) Mother    Diabetes Mother    Obesity Father    High blood pressure (Hypertension) Father    Coronary Artery Disease (Blocked arteries around heart) Father    Diabetes Father        Tobacco Use History Social History    Tobacco Use Smoking Status Former  Types: Cigarettes Smokeless Tobacco Never      Social History Social History    Socioeconomic History  Marital status: Married Tobacco Use  Smoking status: Former     Types: Cigarettes  Smokeless tobacco: Never Substance and Sexual Activity  Alcohol use: Yes  Drug use: Never    Social Drivers of Manufacturing engineer Strain: Low Risk  (10/29/2022)   Received from [facility]   Overall Financial Resource Strain (CARDIA)    Difficulty of Paying Living Expenses: Not hard at all Food Insecurity: No Food Insecurity (10/29/2022)   Received from [facility]   Hunger Vital Sign    Worried About Running Out of Food in the Last Year: Never true    Ran Out of Food in  the Last Year: Never true Physical Activity: Unknown (10/29/2022)   Received from [facility]   Exercise Vital Sign    Days of Exercise per Week: 2 days Stress: No Stress Concern Present (10/29/2022)   Received from [facility] - Occupational Stress Questionnaire    Feeling of Stress : Only a little Social Connections: Unknown (10/29/2022)   Received from [facility]   Social Connection and Isolation Panel [NHANES]    Frequency of Communication with Friends and Family: More than three times a week    Frequency of Social Gatherings with Friends and Family: More than three times a week    Active Member of Clubs or Organizations: Yes    Marital Status: Married Housing  Stability: Unknown (03/13/2023)   Housing Stability Vital Sign    Homeless in the Last Year: No      Objective:   Vitals:   03/13/23 0957 03/13/23 0958 BP: 122/80   Pulse: 72   Temp: 36.9 C (98.5 F)   SpO2: 98%   Weight: 95.7 kg (211 lb)   Height: 170.2 cm (5\' 7" )   PainSc:   0-No pain PainLoc:   Abdomen   Body mass index is 33.05 kg/m.   Physical Exam Vitals reviewed.  Constitutional:      General: She is not in acute distress.    Appearance: Normal appearance.  HENT:     Head: Normocephalic and atraumatic.  Cardiovascular:     Rate and Rhythm: Normal rate and regular rhythm.  Pulmonary:     Effort: Pulmonary effort is normal. No respiratory distress.  Abdominal:     General: There is no distension.     Palpations: Abdomen is soft.     Tenderness: There is no abdominal tenderness.  Musculoskeletal:        General: Normal range of motion.  Skin:    General: Skin is warm and dry.     Coloration: Skin is not jaundiced.  Neurological:     General: No focal deficit present.     Mental Status: She is alert and oriented to person, place, and time.            Labs, Imaging and Diagnostic Testing: US abdomen 03/05/23: IMPRESSION: 1. Increased hepatic echotexture, most commonly seen with steatosis. Correlation with LFT's is recommended. Complex cyst within the right lobe of the liver.   2. Cholelithiasis with no evidence of acute cholecystitis. No biliary dilatation.     Assessment and Plan:   Assessment Diagnoses and all orders for this visit:   Calculus of gallbladder without cholecystitis without obstruction     53 yo female with postprandial RUQ abdominal pain and cholelithiasis.  I have personally reviewed her labs, imaging, and notes.  Her symptoms are very typical of biliary colic. Laparoscopic cholecystectomy was recommended. The details of this procedure were discussed with the patient, including the risks of bleeding, infection, bile leak, and  <0.5% risk of common bile duct injury. The patient expressed understanding and agrees to proceed with surgery.  Will try to schedule her as soon as possible next week given the frequency and severity of her symptoms.  She was instructed to go to the ED in the meantime if she has fevers or severe persistent pain.  All questions were answered.    Sophronia Simas, MD [facility] Surgery General, Hepatobiliary and Pancreatic Surgery 03/13/23 10:39 AM

## 2023-03-13 NOTE — H&P (View-Only) (Signed)
 History of Present Illness: Maria Graves is a 53 y.o. female who is seen today as an office consultation for evaluation of gallstones.  About a month ago she began having pain in the right upper quadrant.  It is associated with nausea but no vomiting.  It now occurs every time she eats, especially foods with any grease.  She is completely cut out dairy and meat from her diet, which has helped but she continues to have frequent symptoms.  She had a RUQ [place] on 2/4 which showed cholelithiasis.  She had LFTs 2 weeks ago which were normal.   She has not had any prior abdominal surgeries.  She has multiple sclerosis and is on Ocrevus infusions, which she gets every 6 months (she just had her most recent infusion last month).       Review of Systems: A complete review of systems was obtained from the patient.  I have reviewed this information and discussed as appropriate with the patient.  See HPI as well for other ROS.     Medical History: Past Medical History Past Medical History: Diagnosis Date  Arthritis    GERD (gastroesophageal reflux disease)         Problem List There is no problem list on file for this patient.     Past Surgical History History reviewed. No pertinent surgical history.     Allergies Allergies Allergen Reactions  Phenazopyridine Rash  Erythromycin Base Nausea  Shellfish Containing Products Swelling  Sulfamethoxazole Hives  Tetanus Toxoid, Adsorbed Swelling     As a child  Nitrofurantoin Macrocrystal Other (See Comments)     Caused fever? See  OV 10/21/2018  Pineapple Itching and Rash      Medications Ordered Prior to Encounter Current Outpatient Medications on File Prior to Visit Medication Sig Dispense Refill  cholecalciferol 1000 unit tablet Take by mouth      L.acid/B.animalis,bifidum/FOS (PROBIOTIC COMPLEX ORAL) Take by mouth      lisinopriL (ZESTRIL) 20 MG tablet Take 20 mg by mouth once daily      pantoprazole (PROTONIX) 40 MG DR tablet Take 40  mg by mouth        No current facility-administered medications on file prior to visit.      Family History Family History Problem Relation Age of Onset  Skin cancer Mother    Obesity Mother    High blood pressure (Hypertension) Mother    Diabetes Mother    Obesity Father    High blood pressure (Hypertension) Father    Coronary Artery Disease (Blocked arteries around heart) Father    Diabetes Father        Tobacco Use History Social History    Tobacco Use Smoking Status Former  Types: Cigarettes Smokeless Tobacco Never      Social History Social History    Socioeconomic History  Marital status: Married Tobacco Use  Smoking status: Former     Types: Cigarettes  Smokeless tobacco: Never Substance and Sexual Activity  Alcohol use: Yes  Drug use: Never    Social Drivers of Manufacturing engineer Strain: Low Risk  (10/29/2022)   Received from [facility]   Overall Financial Resource Strain (CARDIA)    Difficulty of Paying Living Expenses: Not hard at all Food Insecurity: No Food Insecurity (10/29/2022)   Received from [facility]   Hunger Vital Sign    Worried About Running Out of Food in the Last Year: Never true    Ran Out of Food in  the Last Year: Never true Physical Activity: Unknown (10/29/2022)   Received from [facility]   Exercise Vital Sign    Days of Exercise per Week: 2 days Stress: No Stress Concern Present (10/29/2022)   Received from [facility] - Occupational Stress Questionnaire    Feeling of Stress : Only a little Social Connections: Unknown (10/29/2022)   Received from [facility]   Social Connection and Isolation Panel [NHANES]    Frequency of Communication with Friends and Family: More than three times a week    Frequency of Social Gatherings with Friends and Family: More than three times a week    Active Member of Clubs or Organizations: Yes    Marital Status: Married Housing  Stability: Unknown (03/13/2023)   Housing Stability Vital Sign    Homeless in the Last Year: No      Objective:   Vitals:   03/13/23 0957 03/13/23 0958 BP: 122/80   Pulse: 72   Temp: 36.9 C (98.5 F)   SpO2: 98%   Weight: 95.7 kg (211 lb)   Height: 170.2 cm (5\' 7" )   PainSc:   0-No pain PainLoc:   Abdomen   Body mass index is 33.05 kg/m.   Physical Exam Vitals reviewed.  Constitutional:      General: She is not in acute distress.    Appearance: Normal appearance.  HENT:     Head: Normocephalic and atraumatic.  Cardiovascular:     Rate and Rhythm: Normal rate and regular rhythm.  Pulmonary:     Effort: Pulmonary effort is normal. No respiratory distress.  Abdominal:     General: There is no distension.     Palpations: Abdomen is soft.     Tenderness: There is no abdominal tenderness.  Musculoskeletal:        General: Normal range of motion.  Skin:    General: Skin is warm and dry.     Coloration: Skin is not jaundiced.  Neurological:     General: No focal deficit present.     Mental Status: She is alert and oriented to person, place, and time.            Labs, Imaging and Diagnostic Testing: US abdomen 03/05/23: IMPRESSION: 1. Increased hepatic echotexture, most commonly seen with steatosis. Correlation with LFT's is recommended. Complex cyst within the right lobe of the liver.   2. Cholelithiasis with no evidence of acute cholecystitis. No biliary dilatation.     Assessment and Plan:   Assessment Diagnoses and all orders for this visit:   Calculus of gallbladder without cholecystitis without obstruction     53 yo female with postprandial RUQ abdominal pain and cholelithiasis.  I have personally reviewed her labs, imaging, and notes.  Her symptoms are very typical of biliary colic. Laparoscopic cholecystectomy was recommended. The details of this procedure were discussed with the patient, including the risks of bleeding, infection, bile leak, and  <0.5% risk of common bile duct injury. The patient expressed understanding and agrees to proceed with surgery.  Will try to schedule her as soon as possible next week given the frequency and severity of her symptoms.  She was instructed to go to the ED in the meantime if she has fevers or severe persistent pain.  All questions were answered.    Sophronia Simas, MD [facility] Surgery General, Hepatobiliary and Pancreatic Surgery 03/13/23 10:39 AM

## 2023-03-13 NOTE — Patient Instructions (Signed)
DUE TO COVID-19 ONLY TWO VISITORS  (aged 55 and older)  ARE ALLOWED TO COME WITH YOU AND STAY IN THE WAITING ROOM ONLY DURING PRE OP AND PROCEDURE.   **NO VISITORS ARE ALLOWED IN THE SHORT STAY AREA OR RECOVERY ROOM!!**  IF YOU WILL BE ADMITTED INTO THE HOSPITAL YOU ARE ALLOWED ONLY FOUR SUPPORT PEOPLE DURING VISITATION HOURS ONLY (7 AM -8PM)   The support person(s) must pass our screening, gel in and out, and wear a mask at all times, including in the patient's room. Patients must also wear a mask when staff or their support person are in the room. Visitors GUEST BADGE MUST BE WORN VISIBLY  One adult visitor may remain with you overnight and MUST be in the room by 8 P.M.     Your procedure is scheduled on: 03/18/23   Report to [facility] Main Entrance    Report to admitting at : 5:15 AM   Call this number if you have problems the morning of surgery [phone]    Eat a light diet the day before surgery.  Examples including soups, broths, toast, yogurt, mashed potatoes.  Things to avoid include carbonated beverages (fizzy beverages), raw fruits and raw vegetables, or beans.   If your bowels are filled with gas, your surgeon will have difficulty visualizing your pelvic organs which increases your surgical risks.  Do not eat food or drink: After Midnight.  FOLLOW ANY ADDITIONAL PRE OP INSTRUCTIONS YOU RECEIVED FROM YOUR SURGEON'S OFFICE!!!   Oral Hygiene is also important to reduce your risk of infection.                                    Remember - BRUSH YOUR TEETH THE MORNING OF SURGERY WITH YOUR REGULAR TOOTHPASTE  DENTURES WILL BE REMOVED PRIOR TO SURGERY PLEASE DO NOT APPLY "Poly grip" OR ADHESIVES!!!   Do NOT smoke after Midnight   Take these medicines the morning of surgery with A SIP OF WATER: pantoprazole,keflex.  DO NOT TAKE ANY ORAL DIABETIC MEDICATIONS DAY OF YOUR SURGERY                              You may not have any metal on your body including hair  pins, jewelry, and body piercing             Do not wear make-up, lotions, powders, perfumes/cologne, or deodorant  Do not wear nail polish including gel and S&S, artificial/acrylic nails, or any other type of covering on natural nails including finger and toenails. If you have artificial nails, gel coating, etc. that needs to be removed by a nail salon please have this removed prior to surgery or surgery may need to be canceled/ delayed if the surgeon/ anesthesia feels like they are unable to be safely monitored.   Do not shave  48 hours prior to surgery.    Do not bring valuables to the hospital. [facility] IS NOT             RESPONSIBLE   FOR VALUABLES.   Contacts, glasses, or bridgework may not be worn into surgery.   Bring small overnight bag day of surgery.   DO NOT BRING YOUR HOME MEDICATIONS TO THE HOSPITAL. PHARMACY WILL DISPENSE MEDICATIONS LISTED ON YOUR MEDICATION LIST TO YOU DURING YOUR ADMISSION IN THE HOSPITAL!    Patients  discharged on the day of surgery will not be allowed to drive home.  Someone NEEDS to stay with you for the first 24 hours after anesthesia.   Special Instructions: Bring a copy of your healthcare power of attorney and living will documents         the day of surgery if you haven't scanned them before.              Please read over the following fact sheets you were given: IF YOU HAVE QUESTIONS ABOUT YOUR PRE-OP INSTRUCTIONS PLEASE CALL [phone]    [facility] - Preparing for Surgery Before surgery, you can play an important role.  Because skin is not sterile, your skin needs to be as free of germs as possible.  You can reduce the number of germs on your skin by washing with CHG (chlorahexidine gluconate) soap before surgery.  CHG is an antiseptic cleaner which kills germs and bonds with the skin to continue killing germs even after washing. Please DO NOT use if you have an allergy to CHG or antibacterial soaps.  If your skin becomes reddened/irritated  stop using the CHG and inform your nurse when you arrive at Short Stay. Do not shave (including legs and underarms) for at least 48 hours prior to the first CHG shower.  You may shave your face/neck. Please follow these instructions carefully:  1.  Shower with CHG Soap the night before surgery and the  morning of Surgery.  2.  If you choose to wash your hair, wash your hair first as usual with your  normal  shampoo.  3.  After you shampoo, rinse your hair and body thoroughly to remove the  shampoo.                           4.  Use CHG as you would any other liquid soap.  You can apply chg directly  to the skin and wash                       Gently with a scrungie or clean washcloth.  5.  Apply the CHG Soap to your body ONLY FROM THE NECK DOWN.   Do not use on face/ open                           Wound or open sores. Avoid contact with eyes, ears mouth and genitals (private parts).                       Wash face,  Genitals (private parts) with your normal soap.             6.  Wash thoroughly, paying special attention to the area where your surgery  will be performed.  7.  Thoroughly rinse your body with warm water from the neck down.  8.  DO NOT shower/wash with your normal soap after using and rinsing off  the CHG Soap.                9.  Pat yourself dry with a clean towel.            10.  Wear clean pajamas.            11.  Place clean sheets on your bed the night of your first shower and do not  sleep with pets. Day of  Surgery : Do not apply any lotions/deodorants the morning of surgery.  Please wear clean clothes to the hospital/surgery center.  FAILURE TO FOLLOW THESE INSTRUCTIONS MAY RESULT IN THE CANCELLATION OF YOUR SURGERY PATIENT SIGNATURE_________________________________  NURSE SIGNATURE__________________________________  ________________________________________________________________________

## 2023-03-14 ENCOUNTER — Encounter (HOSPITAL_COMMUNITY): Payer: Self-pay

## 2023-03-14 ENCOUNTER — Encounter (HOSPITAL_COMMUNITY)
Admission: RE | Admit: 2023-03-14 | Discharge: 2023-03-14 | Disposition: A | Discharge status: Home / Self Care | Payer: 59 | Source: Ambulatory Visit | Attending: Surgery | Admitting: Surgery

## 2023-03-14 ENCOUNTER — Other Ambulatory Visit: Payer: Self-pay

## 2023-03-14 VITALS — BP 134/82 | HR 64 | Temp 97.8°F | Ht 66.0 in | Wt 209.0 lb

## 2023-03-14 DIAGNOSIS — Z01818 Encounter for other preprocedural examination: Secondary | ICD-10-CM | POA: Insufficient documentation

## 2023-03-14 DIAGNOSIS — I10 Essential (primary) hypertension: Secondary | ICD-10-CM | POA: Insufficient documentation

## 2023-03-14 HISTORY — DX: Family history of other specified conditions: Z84.89

## 2023-03-14 HISTORY — DX: Nontoxic goiter, unspecified: E04.9

## 2023-03-14 HISTORY — DX: Pneumonia, unspecified organism: J18.9

## 2023-03-14 NOTE — Progress Notes (Signed)
For Anesthesia: PCP - Wanda Plump, MD  Cardiologist - N/A  Bowel Prep reminder:  Chest x-ray -  EKG - 03/14/23 Stress Test -  ECHO -  Cardiac Cath -  Pacemaker/ICD device last checked: Pacemaker orders received: Device Rep notified:  Spinal Cord Stimulator:N/A  Sleep Study - N/A CPAP -   Fasting Blood Sugar - N/A Checks Blood Sugar _____ times a day Date and result of last Hgb A1c-  Last dose of GLP1 agonist- N/A GLP1 instructions:   Last dose of SGLT-2 inhibitors- N/A SGLT-2 instructions:   Blood Thinner Instructions:N/A Aspirin Instructions: Last Dose:  Activity level: Can go up a flight of stairs and activities of daily living without stopping and without chest pain and/or shortness of breath   Able to exercise without chest pain and/or shortness of breath  Anesthesia review: Hx: HTN  Patient denies shortness of breath, fever, cough and chest pain at PAT appointment   Patient verbalized understanding of instructions that were given to them at the PAT appointment. Patient was also instructed that they will need to review over the PAT instructions again at home before surgery.

## 2023-03-18 ENCOUNTER — Ambulatory Visit (HOSPITAL_COMMUNITY): Payer: 59 | Admitting: Certified Registered Nurse Anesthetist

## 2023-03-18 ENCOUNTER — Ambulatory Visit (HOSPITAL_COMMUNITY)
Admission: RE | Admit: 2023-03-18 | Discharge: 2023-03-18 | Disposition: A | Discharge status: Home / Self Care | Payer: 59 | Attending: Surgery | Admitting: Surgery

## 2023-03-18 ENCOUNTER — Encounter (HOSPITAL_COMMUNITY): Payer: Self-pay | Admitting: Surgery

## 2023-03-18 ENCOUNTER — Ambulatory Visit (HOSPITAL_BASED_OUTPATIENT_CLINIC_OR_DEPARTMENT_OTHER): Payer: 59 | Admitting: Certified Registered Nurse Anesthetist

## 2023-03-18 ENCOUNTER — Encounter (HOSPITAL_COMMUNITY)
Admission: RE | Disposition: A | Discharge status: Home / Self Care | Payer: Self-pay | Source: Home / Self Care | Attending: Surgery

## 2023-03-18 ENCOUNTER — Other Ambulatory Visit: Payer: Self-pay

## 2023-03-18 DIAGNOSIS — K219 Gastro-esophageal reflux disease without esophagitis: Secondary | ICD-10-CM | POA: Insufficient documentation

## 2023-03-18 DIAGNOSIS — K8020 Calculus of gallbladder without cholecystitis without obstruction: Secondary | ICD-10-CM | POA: Diagnosis not present

## 2023-03-18 DIAGNOSIS — I10 Essential (primary) hypertension: Secondary | ICD-10-CM | POA: Diagnosis not present

## 2023-03-18 DIAGNOSIS — E119 Type 2 diabetes mellitus without complications: Secondary | ICD-10-CM | POA: Diagnosis not present

## 2023-03-18 DIAGNOSIS — K8010 Calculus of gallbladder with chronic cholecystitis without obstruction: Secondary | ICD-10-CM | POA: Insufficient documentation

## 2023-03-18 DIAGNOSIS — G35 Multiple sclerosis: Secondary | ICD-10-CM | POA: Diagnosis not present

## 2023-03-18 DIAGNOSIS — Z87891 Personal history of nicotine dependence: Secondary | ICD-10-CM | POA: Insufficient documentation

## 2023-03-18 HISTORY — PX: CHOLECYSTECTOMY: SHX55

## 2023-03-18 SURGERY — LAPAROSCOPIC CHOLECYSTECTOMY
Anesthesia: General | Site: Abdomen

## 2023-03-18 MED ORDER — DEXAMETHASONE SODIUM PHOSPHATE 10 MG/ML IJ SOLN
INTRAMUSCULAR | Status: AC
  Filled 2023-03-18: qty 1

## 2023-03-18 MED ORDER — SUGAMMADEX SODIUM 200 MG/2ML IV SOLN
INTRAVENOUS | Status: DC | PRN
  Administered 2023-03-18: 200 mg via INTRAVENOUS

## 2023-03-18 MED ORDER — ACETAMINOPHEN 325 MG PO TABS: 325.0000 mg | ORAL_TABLET | ORAL | Status: DC | PRN

## 2023-03-18 MED ORDER — STERILE WATER FOR IRRIGATION IR SOLN
Status: DC | PRN
Start: 2023-03-18 — End: 2023-03-18
  Administered 2023-03-18: 1000 mL

## 2023-03-18 MED ORDER — CELECOXIB 200 MG PO CAPS
200.0000 mg | ORAL_CAPSULE | ORAL | Status: AC
  Administered 2023-03-18: 200 mg via ORAL
  Filled 2023-03-18: qty 1

## 2023-03-18 MED ORDER — SCOPOLAMINE 1 MG/3DAYS TD PT72: 1.0000 | MEDICATED_PATCH | TRANSDERMAL | Status: DC

## 2023-03-18 MED ORDER — FENTANYL CITRATE PF 50 MCG/ML IJ SOSY
PREFILLED_SYRINGE | INTRAMUSCULAR | Status: AC
  Filled 2023-03-18: qty 1

## 2023-03-18 MED ORDER — ONDANSETRON HCL 4 MG/2ML IJ SOLN
INTRAMUSCULAR | Status: AC
  Filled 2023-03-18: qty 2

## 2023-03-18 MED ORDER — ROCURONIUM BROMIDE 10 MG/ML (PF) SYRINGE
PREFILLED_SYRINGE | INTRAVENOUS | Status: DC | PRN
  Administered 2023-03-18: 60 mg via INTRAVENOUS

## 2023-03-18 MED ORDER — ROCURONIUM BROMIDE 10 MG/ML (PF) SYRINGE
PREFILLED_SYRINGE | INTRAVENOUS | Status: AC
  Filled 2023-03-18: qty 10

## 2023-03-18 MED ORDER — LACTATED RINGERS IR SOLN
Status: DC | PRN
  Administered 2023-03-18: 1000 mL

## 2023-03-18 MED ORDER — ACETAMINOPHEN 500 MG PO TABS
1000.0000 mg | ORAL_TABLET | ORAL | Status: AC
  Administered 2023-03-18: 1000 mg via ORAL
  Filled 2023-03-18: qty 2

## 2023-03-18 MED ORDER — SCOPOLAMINE 1 MG/3DAYS TD PT72
MEDICATED_PATCH | TRANSDERMAL | Status: AC
  Filled 2023-03-18: qty 1

## 2023-03-18 MED ORDER — KETOROLAC TROMETHAMINE 30 MG/ML IJ SOLN
INTRAMUSCULAR | Status: DC | PRN
  Administered 2023-03-18: 30 mg via INTRAVENOUS

## 2023-03-18 MED ORDER — OXYCODONE HCL 5 MG PO TABS: 5.0000 mg | ORAL_TABLET | Freq: Once | ORAL | Status: DC | PRN

## 2023-03-18 MED ORDER — CHLORHEXIDINE GLUCONATE 0.12 % MT SOLN
15.0000 mL | Freq: Once | OROMUCOSAL | Status: AC
  Administered 2023-03-18: 15 mL via OROMUCOSAL

## 2023-03-18 MED ORDER — DROPERIDOL 2.5 MG/ML IJ SOLN
INTRAMUSCULAR | Status: DC
Start: 2023-03-18 — End: 2023-03-18
  Filled 2023-03-18: qty 2

## 2023-03-18 MED ORDER — OXYCODONE HCL 5 MG/5ML PO SOLN: 5.0000 mg | Freq: Once | ORAL | Status: DC | PRN

## 2023-03-18 MED ORDER — FENTANYL CITRATE (PF) 100 MCG/2ML IJ SOLN
INTRAMUSCULAR | Status: AC
  Filled 2023-03-18: qty 2

## 2023-03-18 MED ORDER — DEXAMETHASONE SODIUM PHOSPHATE 4 MG/ML IJ SOLN
INTRAMUSCULAR | Status: DC | PRN
  Administered 2023-03-18: 4 mg via INTRAVENOUS

## 2023-03-18 MED ORDER — MIDAZOLAM HCL 2 MG/2ML IJ SOLN
INTRAMUSCULAR | Status: AC
  Filled 2023-03-18: qty 2

## 2023-03-18 MED ORDER — MIDAZOLAM HCL 5 MG/5ML IJ SOLN
INTRAMUSCULAR | Status: DC | PRN
  Administered 2023-03-18: 2 mg via INTRAVENOUS

## 2023-03-18 MED ORDER — ACETAMINOPHEN 10 MG/ML IV SOLN: 1000.0000 mg | Freq: Once | INTRAVENOUS | Status: DC | PRN

## 2023-03-18 MED ORDER — LIDOCAINE HCL (PF) 2 % IJ SOLN
INTRAMUSCULAR | Status: AC
  Filled 2023-03-18: qty 5

## 2023-03-18 MED ORDER — LIDOCAINE 2% (20 MG/ML) 5 ML SYRINGE
INTRAMUSCULAR | Status: DC | PRN
  Administered 2023-03-18: 60 mg via INTRAVENOUS

## 2023-03-18 MED ORDER — ACETAMINOPHEN 160 MG/5ML PO SOLN: 325.0000 mg | ORAL | Status: DC | PRN

## 2023-03-18 MED ORDER — ONDANSETRON HCL 4 MG/2ML IJ SOLN
INTRAMUSCULAR | Status: DC | PRN
Start: 2023-03-18 — End: 2023-03-18
  Administered 2023-03-18: 4 mg via INTRAVENOUS

## 2023-03-18 MED ORDER — FENTANYL CITRATE PF 50 MCG/ML IJ SOSY
25.0000 ug | PREFILLED_SYRINGE | INTRAMUSCULAR | Status: DC | PRN
  Administered 2023-03-18: 50 ug via INTRAVENOUS

## 2023-03-18 MED ORDER — DROPERIDOL 2.5 MG/ML IJ SOLN
0.6250 mg | Freq: Once | INTRAMUSCULAR | Status: AC | PRN
  Administered 2023-03-18: 0.625 mg via INTRAVENOUS

## 2023-03-18 MED ORDER — PROPOFOL 10 MG/ML IV BOLUS
INTRAVENOUS | Status: DC | PRN
  Administered 2023-03-18: 150 mg via INTRAVENOUS

## 2023-03-18 MED ORDER — FENTANYL CITRATE (PF) 100 MCG/2ML IJ SOLN
INTRAMUSCULAR | Status: DC | PRN
  Administered 2023-03-18 (×4): 50 ug via INTRAVENOUS

## 2023-03-18 MED ORDER — LACTATED RINGERS IV SOLN: INTRAVENOUS | Status: DC

## 2023-03-18 MED ORDER — CEFAZOLIN SODIUM-DEXTROSE 2-4 GM/100ML-% IV SOLN
2.0000 g | INTRAVENOUS | Status: AC
  Administered 2023-03-18: 2 g via INTRAVENOUS
  Filled 2023-03-18: qty 100

## 2023-03-18 MED ORDER — HYDROCODONE-ACETAMINOPHEN 5-325 MG PO TABS: 1.0000 | ORAL_TABLET | Freq: Four times a day (QID) | ORAL | 0 refills | Status: AC | PRN

## 2023-03-18 MED ORDER — ORAL CARE MOUTH RINSE: 15.0000 mL | Freq: Once | OROMUCOSAL | Status: AC

## 2023-03-18 MED ORDER — PROPOFOL 10 MG/ML IV BOLUS
INTRAVENOUS | Status: AC
  Filled 2023-03-18: qty 20

## 2023-03-18 MED ORDER — BUPIVACAINE-EPINEPHRINE 0.25% -1:200000 IJ SOLN
INTRAMUSCULAR | Status: AC
  Filled 2023-03-18: qty 1

## 2023-03-18 SURGICAL SUPPLY — 41 items
APPLIER CLIP 5 13 M/L LIGAMAX5 (MISCELLANEOUS) ×1
BAG COUNTER SPONGE SURGICOUNT (BAG) IMPLANT
CHLORAPREP W/TINT 26 (MISCELLANEOUS) ×1 IMPLANT
CLIP APPLIE 5 13 M/L LIGAMAX5 (MISCELLANEOUS) ×1 IMPLANT
COVER MAYO STAND XLG (MISCELLANEOUS) ×1 IMPLANT
COVER SURGICAL LIGHT HANDLE (MISCELLANEOUS) ×1 IMPLANT
DERMABOND ADVANCED .7 DNX12 (GAUZE/BANDAGES/DRESSINGS) ×1 IMPLANT
DRAPE C-ARM 42X120 X-RAY (DRAPES) IMPLANT
ELECT L-HOOK LAP 45CM DISP (ELECTROSURGICAL)
ELECT PENCIL ROCKER SW 15FT (MISCELLANEOUS) ×1 IMPLANT
ELECT REM PT RETURN 15FT ADLT (MISCELLANEOUS) ×1 IMPLANT
ELECTRODE L-HOOK LAP 45CM DISP (ELECTROSURGICAL) IMPLANT
ENDOLOOP SUT PDS II 0 18 (SUTURE) IMPLANT
GLOVE BIOGEL PI IND STRL 6 (GLOVE) ×1 IMPLANT
GLOVE BIOGEL PI MICRO STRL 5.5 (GLOVE) ×1 IMPLANT
GOWN STRL REUS W/ TWL LRG LVL3 (GOWN DISPOSABLE) ×1 IMPLANT
GRASPER SUT TROCAR 14GX15 (MISCELLANEOUS) IMPLANT
HEMOSTAT SNOW SURGICEL 2X4 (HEMOSTASIS) IMPLANT
IRRIG SUCT STRYKERFLOW 2 WTIP (MISCELLANEOUS) ×1
IRRIGATION SUCT STRKRFLW 2 WTP (MISCELLANEOUS) ×1 IMPLANT
KIT BASIN OR (CUSTOM PROCEDURE TRAY) ×1 IMPLANT
KIT TURNOVER KIT A (KITS) IMPLANT
L-HOOK LAP DISP 36CM (ELECTROSURGICAL) ×1
LHOOK LAP DISP 36CM (ELECTROSURGICAL) ×1 IMPLANT
NDL INSUFFLATION 14GA 120MM (NEEDLE) IMPLANT
NEEDLE INSUFFLATION 14GA 120MM (NEEDLE)
POUCH RETRIEVAL ECOSAC 10 (ENDOMECHANICALS) IMPLANT
SCISSORS LAP 5X35 DISP (ENDOMECHANICALS) ×1 IMPLANT
SET CHOLANGIOGRAPH MIX (MISCELLANEOUS) IMPLANT
SET TUBE SMOKE EVAC HIGH FLOW (TUBING) ×1 IMPLANT
SLEEVE Z-THREAD 5X100MM (TROCAR) ×2 IMPLANT
SPIKE FLUID TRANSFER (MISCELLANEOUS) ×1 IMPLANT
SUT MNCRL AB 4-0 PS2 18 (SUTURE) ×1 IMPLANT
SUT VICRYL 0 UR6 27IN ABS (SUTURE) IMPLANT
SYS BAG RETRIEVAL 10MM (BASKET) ×1
SYSTEM BAG RETRIEVAL 10MM (BASKET) IMPLANT
TOWEL OR 17X26 10 PK STRL BLUE (TOWEL DISPOSABLE) ×1 IMPLANT
TRAY LAPAROSCOPIC (CUSTOM PROCEDURE TRAY) ×1 IMPLANT
TROCAR ADV FIXATION 12X100MM (TROCAR) IMPLANT
TROCAR BALLN 12MMX100 BLUNT (TROCAR) IMPLANT
TROCAR Z-THREAD OPTICAL 5X100M (TROCAR) ×1 IMPLANT

## 2023-03-18 NOTE — Anesthesia Postprocedure Evaluation (Signed)
Anesthesia Post Note  Patient: Makenzye Troutman Minich  Procedure(s) Performed: LAPAROSCOPIC CHOLECYSTECTOMY (Abdomen)     Patient location during evaluation: PACU Anesthesia Type: General Level of consciousness: awake and alert Pain management: pain level controlled Vital Signs Assessment: post-procedure vital signs reviewed and stable Respiratory status: spontaneous breathing, nonlabored ventilation, respiratory function stable and patient connected to nasal cannula oxygen Cardiovascular status: blood pressure returned to baseline and stable Postop Assessment: no apparent nausea or vomiting Anesthetic complications: no  No notable events documented.  Last Vitals:  Vitals:   03/18/23 0945 03/18/23 1000  BP: (!) 148/73 (!) 164/93  Pulse: 72 73  Resp: 12 16  Temp:    SpO2: 100% 100%    Last Pain:  Vitals:   03/18/23 1000  TempSrc:   PainSc: 3                  Shelton Silvas

## 2023-03-18 NOTE — Interval H&P Note (Signed)
History and Physical Interval Note:  03/18/2023 7:22 AM  Maria Graves  has presented today for surgery, with the diagnosis of gallstones.  The various methods of treatment have been discussed with the patient and family. After consideration of risks, benefits and other options for treatment, the patient has consented to  Procedure(s): LAPAROSCOPIC CHOLECYSTECTOMY (N/A) as a surgical intervention.  The patient's history has been reviewed, patient examined, no change in status, stable for surgery.  I have reviewed the patient's chart and labs.  Questions were answered to the patient's satisfaction.     Fritzi Mandes

## 2023-03-18 NOTE — Transfer of Care (Signed)
Immediate Anesthesia Transfer of Care Note  Patient: Maria Graves  Procedure(s) Performed: Procedure(s): LAPAROSCOPIC CHOLECYSTECTOMY (N/A)  Patient Location: PACU  Anesthesia Type:General  Level of Consciousness: Patient easily awoken, comfortable, cooperative, following commands, responds to stimulation.   Airway & Oxygen Therapy: Patient spontaneously breathing, ventilating well, oxygen via simple oxygen mask.  Post-op Assessment: Report given to PACU RN, vital signs reviewed and stable, moving all extremities.   Post vital signs: Reviewed and stable.  Complications: No apparent anesthesia complications Last Vitals:  Vitals Value Taken Time  BP 163/98 03/18/23 0856  Temp    Pulse 80 03/18/23 0857  Resp 18 03/18/23 0857  SpO2 100 % 03/18/23 0857  Vitals shown include unfiled device data.  Last Pain:  Vitals:   03/18/23 0556  TempSrc: Oral  PainSc: 1          Complications: No notable events documented.

## 2023-03-18 NOTE — Anesthesia Procedure Notes (Signed)
Procedure Name: Intubation Date/Time: 03/18/2023 7:39 AM  Performed by: Ludwig Lean, CRNAPre-anesthesia Checklist: Patient identified, Emergency Drugs available, Suction available and Patient being monitored Patient Re-evaluated:Patient Re-evaluated prior to induction Oxygen Delivery Method: Circle system utilized Preoxygenation: Pre-oxygenation with 100% oxygen Induction Type: IV induction Ventilation: Mask ventilation without difficulty Laryngoscope Size: Mac and 3 Grade View: Grade I Tube type: Oral Tube size: 7.0 mm Number of attempts: 1 Airway Equipment and Method: Stylet Placement Confirmation: ETT inserted through vocal cords under direct vision, positive ETCO2 and breath sounds checked- equal and bilateral Secured at: 21 cm Tube secured with: Tape Dental Injury: Teeth and Oropharynx as per pre-operative assessment

## 2023-03-18 NOTE — Anesthesia Preprocedure Evaluation (Addendum)
Anesthesia Evaluation  Patient identified by MRN, date of birth, ID band Patient awake    Reviewed: Allergy & Precautions, NPO status , Patient's Chart, lab work & pertinent test results  Airway Mallampati: II  TM Distance: >3 FB Neck ROM: Full  Mouth opening: Limited Mouth Opening  Dental  (+) Teeth Intact, Dental Advisory Given   Pulmonary former smoker   breath sounds clear to auscultation       Cardiovascular hypertension, Pt. on medications  Rhythm:Regular Rate:Normal     Neuro/Psych negative neurological ROS  negative psych ROS   GI/Hepatic Neg liver ROS,GERD  Medicated,,  Endo/Other  diabetes    Renal/GU negative Renal ROS     Musculoskeletal  (+) Arthritis ,    Abdominal   Peds  Hematology negative hematology ROS (+)   Anesthesia Other Findings   Reproductive/Obstetrics                             Anesthesia Physical Anesthesia Plan  ASA: 2  Anesthesia Plan: General   Post-op Pain Management: Tylenol PO (pre-op)* and Toradol IV (intra-op)*   Induction: Intravenous  PONV Risk Score and Plan: 4 or greater and Ondansetron, Dexamethasone, Midazolam and Scopolamine patch - Pre-op  Airway Management Planned: Oral ETT and Video Laryngoscope Planned  Additional Equipment: None  Intra-op Plan:   Post-operative Plan: Extubation in OR  Informed Consent: I have reviewed the patients History and Physical, chart, labs and discussed the procedure including the risks, benefits and alternatives for the proposed anesthesia with the patient or authorized representative who has indicated his/her understanding and acceptance.       Plan Discussed with: CRNA  Anesthesia Plan Comments:        Anesthesia Quick Evaluation

## 2023-03-18 NOTE — Op Note (Signed)
Date: 03/18/23  Patient: Maria Graves MRN: [id number]  Preoperative Diagnosis: Symptomatic cholelithiasis Postoperative Diagnosis: Same  Procedure: Laparoscopic cholecystectomy  Surgeon: Sophronia Simas, MD  EBL: 50 mL  Anesthesia: General endotracheal  Specimens: Gallbladder  Indications: Ms. Schrimpf is a 53 yo female who was referred with RUQ abdominal pain, exacerbated by eating. She had a RUQ [place] which confirmed cholelithiasis. After a discussion of the risks and benefits of surgery, she agreed to proceed with cholecystectomy.  Findings: Cholelithiasis with signs of chronic cholecystitis.  Procedure details: Informed consent was obtained in the preoperative area prior to the procedure. The patient was brought to the operating room and placed on the table in the supine position. General anesthesia was induced and appropriate lines and drains were placed for intraoperative monitoring. Perioperative antibiotics were administered per SCIP guidelines. The abdomen was prepped and draped in the usual sterile fashion. A pre-procedure timeout was taken verifying patient identity, surgical site and procedure to be performed.  A small infraumbilical skin incision was made, the subcutaneous tissue was divided with cautery, and the umbilical stalk was grasped and elevated. The fascia was incised and the peritoneal cavity was directly visualized. A 12mm Hassan trocar was placed and the abdomen was insufflated. The peritoneal cavity was inspected with no evidence of visceral or vascular injury. Three 5mm ports were placed in the right subcostal margin, all under direct visualization. The fundus of the gallbladder was grasped and retracted cephalad. Most of the remainder of the gallbladder was covered with omental adhesions. These were carefully divided with cautery, and swept down using blunt dissection. The infundibulum was retracted laterally. The peritoneum overlying the gallbladder was incised,  starting high on the infundibulum. There were some fatty inflammatory adhesions over the cystic triangle which were swept medially. The cystic triangle was dissected out using cautery and blunt dissection. The cystic artery was first clipped and divided high on the gallbladder wall. Once the cystic triangle was further dissected out and the critical view of safety was obtained, the cystic artery was clipped and divided more proximally in the cystic triangle. The cystic duct was dilated and there was a stone palpable lodged within the cystic duct. This was milked up into the gallbladder and out through a defect in the gallbladder wall. The lower third of the gallbladder was taken off the liver using cautery. The cystic duct was sharply divided, and the cystic duct stump was closed with a PDS endoloop (the cystic duct was too wide to place a clip across). The remainder of the gallbladder was taken off the liver using cautery, and the specimen was placed in an endocatch bag. Two dropped stones were also placed in the bag. The surgical site was irrigated with saline until the effluent was clear. Hemostasis was achieved in the gallbladder fossa using cautery. The cystic duct and artery stumps were visually inspected and there was no evidence of bile leak or bleeding. The ports were removed under direct visualization and the abdomen was desufflated. The specimen was extracted via the umbilical port site. The umbilical port site fascia was closed with a 0 vicryl suture. The skin at all port sites was closed with 4-0 monocryl subcuticular suture. Dermabond was applied.  The patient tolerated the procedure well with no apparent complications. All counts were correct x2 at the end of the procedure. The patient was extubated and taken to PACU in stable condition.  Sophronia Simas, MD 03/18/23 8:52 AM

## 2023-03-18 NOTE — Discharge Instructions (Addendum)
[facility] DISCHARGE INSTRUCTIONS  Activity No heavy lifting greater than 15 pounds for 4 weeks after surgery. Ok to shower in 24 hours, but do not bathe or submerge incisions underwater for 2 weeks. Do not drive while taking narcotic pain medication. You may drive when you are no longer taking prescription pain medication, you can comfortably wear a seatbelt, and you can safely maneuver your car and apply brakes.  Wound Care Your incisions are covered with skin glue called Dermabond. This will peel off on its own over time. You may shower and allow warm soapy water to run over your incisions. Gently pat dry. Do not submerge your incision underwater until cleared by your surgeon. Monitor your incision for any new redness, tenderness, or drainage. Many patients will experience some swelling and bruising at the incisions.  Ice packs will help.  Swelling and bruising can take several days to resolve.   Medications A  prescription for pain medication may be given to you upon discharge.  Take your pain medication as prescribed, if needed.  If narcotic pain medicine is not needed, then you may take acetaminophen (Tylenol) or ibuprofen (Advil) as needed. It is common to experience some constipation if taking pain medication after surgery.  Increasing fluid intake and taking a stool softener (such as Colace) will usually help or prevent this problem from occurring.  A mild laxative (Milk of Magnesia or Miralax) should be taken according to package directions if there are no bowel movements after 48 hours. Take your usually prescribed medications unless otherwise directed. If you need a refill on your pain medication, please contact your pharmacy.  They will contact our office to request authorization. Prescriptions will not be filled after 5 pm or on weekends.  When to Call us: Fever greater than 100.5 New redness, drainage, or swelling at incision site Severe pain, nausea, or  vomiting Persistent bleeding from incisions Jaundice (yellowing of the whites of the eyes or skin)  Follow-up You have an appointment scheduled with Dr. Freida Busman on April 05, 2023 at 11:00am. This will be at the [facility] Surgery office at [address], [place], [place]. Please arrive at least 15 minutes prior to your scheduled appointment time.  IF YOU HAVE DISABILITY OR FAMILY LEAVE FORMS, YOU MUST BRING THEM TO THE OFFICE FOR PROCESSING.   DO NOT GIVE THEM TO YOUR DOCTOR.  The clinic staff is available to answer your questions during regular business hours.  Please don't hesitate to call and ask to speak to one of the nurses for clinical concerns.  If you have a medical emergency, go to the nearest emergency room or call 911.  A surgeon from [facility] is always on call at the hospital  [address], [address], [place], [place]  [address] ?  [address] [address], [place], [place]   [address] [phone] ? Toll Free: [phone] ? FAX [phone] Web site: [email or web address]      Managing Your Pain After Surgery Without Opioids    Thank you for participating in our program to help patients manage their pain after surgery without opioids. This is part of our effort to provide you with the best care possible, without exposing you or your family to the risk that opioids pose.  What pain can I expect after surgery? You can expect to have some pain after surgery. This is normal. The pain is typically worse the day after surgery, and quickly begins to get better.  Many studies have found that many patients are able to manage their pain after surgery with Over-the-Counter (OTC) medications such as Tylenol and Motrin. If you have a condition that does not allow you to take Tylenol or Motrin, notify your surgical team.  How will I manage my pain? The best strategy for controlling your pain after surgery is around the clock pain control with  Tylenol (acetaminophen) and Motrin (ibuprofen or Advil). Alternating these medications with each other allows you to maximize your pain control. In addition to Tylenol and Motrin, you can use heating pads or ice packs on your incisions to help reduce your pain.  How will I alternate your regular strength over-the-counter pain medication? You will take a dose of pain medication every three hours. Start by taking 650 mg of Tylenol (2 pills of 325 mg) 3 hours later take 600 mg of Motrin (3 pills of 200 mg) 3 hours after taking the Motrin take 650 mg of Tylenol 3 hours after that take 600 mg of Motrin.   - 1 -  See example - if your first dose of Tylenol is at 12:00 PM   12:00 PM Tylenol 650 mg (2 pills of 325 mg)  3:00 PM Motrin 600 mg (3 pills of 200 mg)  6:00 PM Tylenol 650 mg (2 pills of 325 mg)  9:00 PM Motrin 600 mg (3 pills of 200 mg)  Continue alternating every 3 hours   We recommend that you follow this schedule around-the-clock for at least 3 days after surgery, or until you feel that it is no longer needed. Use the table on the last page of this handout to keep track of the medications you are taking. Important: Do not take more than 3000mg  of Tylenol or 3200mg  of Motrin in a 24-hour period. Do not take ibuprofen/Motrin if you have a history of bleeding stomach ulcers, severe kidney disease, &/or actively taking a blood thinner  What if I still have pain? If you have pain that is not controlled with the over-the-counter pain medications (Tylenol and Motrin or Advil) you might have what we call "breakthrough" pain. You will receive a prescription for a small amount of an opioid pain medication such as Oxycodone, Tramadol, or Tylenol with Codeine. Use these opioid pills in the first 24 hours after surgery if you have breakthrough pain. Do not take more than 1 pill every 4-6 hours.  If you still have uncontrolled pain after using all opioid pills, don't hesitate to call our staff  using the number provided. We will help make sure you are managing your pain in the best way possible, and if necessary, we can provide a prescription for additional pain medication.   Day 1    Time  Name of Medication Number of pills taken  Amount of Acetaminophen  Pain Level   Comments  AM PM       AM PM       AM PM       AM PM       AM PM       AM PM       AM PM       AM PM       Total Daily amount of Acetaminophen Do not take more than  3,000 mg per day      Day 2    Time  Name of Medication Number of pills taken  Amount of Acetaminophen  Pain Level   Comments  AM PM  AM PM       AM PM       AM PM       AM PM       AM PM       AM PM       AM PM       Total Daily amount of Acetaminophen Do not take more than  3,000 mg per day      Day 3    Time  Name of Medication Number of pills taken  Amount of Acetaminophen  Pain Level   Comments  AM PM       AM PM       AM PM       AM PM         AM PM       AM PM       AM PM       AM PM       Total Daily amount of Acetaminophen Do not take more than  3,000 mg per day      Day 4    Time  Name of Medication Number of pills taken  Amount of Acetaminophen  Pain Level   Comments  AM PM       AM PM       AM PM       AM PM       AM PM       AM PM       AM PM       AM PM       Total Daily amount of Acetaminophen Do not take more than  3,000 mg per day      Day 5    Time  Name of Medication Number of pills taken  Amount of Acetaminophen  Pain Level   Comments  AM PM       AM PM       AM PM       AM PM       AM PM       AM PM       AM PM       AM PM       Total Daily amount of Acetaminophen Do not take more than  3,000 mg per day      Day 6    Time  Name of Medication Number of pills taken  Amount of Acetaminophen  Pain Level  Comments  AM PM       AM PM       AM PM       AM PM       AM PM       AM PM       AM PM       AM PM       Total Daily amount of  Acetaminophen Do not take more than  3,000 mg per day      Day 7    Time  Name of Medication Number of pills taken  Amount of Acetaminophen  Pain Level   Comments  AM PM       AM PM       AM PM       AM PM       AM PM       AM PM       AM PM       AM PM  Total Daily amount of Acetaminophen Do not take more than  3,000 mg per day        For additional information about how and where to safely dispose of unused opioid medications - [email or web address]  Disclaimer: This document contains information and/or instructional materials adapted from [facility] for the typical patient with your condition. It does not replace medical advice from your health care provider because your experience may differ from that of the typical patient. Talk to your health care provider if you have any questions about this document, your condition or your treatment plan. Adapted from [facility]

## 2023-03-19 ENCOUNTER — Encounter (HOSPITAL_COMMUNITY): Payer: Self-pay | Admitting: Surgery

## 2023-03-19 LAB — SURGICAL PATHOLOGY

## 2023-07-02 ENCOUNTER — Ambulatory Visit (INDEPENDENT_AMBULATORY_CARE_PROVIDER_SITE_OTHER): Payer: 59 | Admitting: Internal Medicine

## 2023-07-02 ENCOUNTER — Encounter: Payer: Self-pay | Admitting: Internal Medicine

## 2023-07-02 VITALS — BP 132/62 | HR 78 | Temp 98.1°F | Resp 16 | Ht 66.0 in | Wt 189.1 lb

## 2023-07-02 DIAGNOSIS — Z1231 Encounter for screening mammogram for malignant neoplasm of breast: Secondary | ICD-10-CM

## 2023-07-02 DIAGNOSIS — I10 Essential (primary) hypertension: Secondary | ICD-10-CM

## 2023-07-02 DIAGNOSIS — K644 Residual hemorrhoidal skin tags: Secondary | ICD-10-CM

## 2023-07-02 DIAGNOSIS — R739 Hyperglycemia, unspecified: Secondary | ICD-10-CM | POA: Diagnosis not present

## 2023-07-02 DIAGNOSIS — K760 Fatty (change of) liver, not elsewhere classified: Secondary | ICD-10-CM

## 2023-07-02 LAB — LIPID PANEL
Cholesterol: 177 mg/dL (ref 0–200)
HDL: 57.7 mg/dL (ref >39.00)
LDL Cholesterol: 94 mg/dL (ref 0–99)
NonHDL: 119
Total CHOL/HDL Ratio: 3
Triglycerides: 123 mg/dL (ref 0.0–149.0)
VLDL: 24.6 mg/dL (ref 0.0–40.0)

## 2023-07-02 LAB — ALT: ALT: 16 U/L (ref 0–35)

## 2023-07-02 LAB — AST: AST: 13 U/L (ref 0–37)

## 2023-07-02 LAB — HEMOGLOBIN A1C: Hgb A1c MFr Bld: 5.7 % (ref 4.6–6.5)

## 2023-07-02 MED ORDER — LISINOPRIL 10 MG PO TABS: 10.0000 mg | ORAL_TABLET | Freq: Every day | ORAL | Status: DC

## 2023-07-02 NOTE — Progress Notes (Signed)
 Subjective:    Patient ID: Maria Graves, female    DOB: July 06, 1970, 53 y.o.   MRN: [id number]  DOS:  07/02/2023 Type of visit - description: Routine visit  Since last visit, had a cholecystectomy, recuperating well. US  showed fatty liver, she got really concerned about the issue. Has completely changed her diet and has lost more than 20 pounds on her own scales.  C/o chronic hemorrhoids, they are palpable, external, occasional bleeding on and off.  Wt Readings from Last 3 Encounters:  07/02/23 189 lb 2 oz (85.8 kg)  03/18/23 209 lb (94.8 kg)  03/14/23 209 lb (94.8 kg)     Review of Systems See above   Past Medical History:  Diagnosis Date   Arthritis    bilateral hands   Contraception    husband vasectomy   Essential tremor    Family history of adverse reaction to anesthesia    Mother heart stop twice when she was under anesthesia.   Gestational diabetes    Goiter, nodular    HTN (hypertension)    on meds   MS (multiple sclerosis) (HCC)    Ovarian cyst 10/23/2022   negative for malignancy   Pneumonia    Proteinuria    Reports mild, chronic proteinuria    Past Surgical History:  Procedure Laterality Date   CHOLECYSTECTOMY N/A 03/18/2023   Procedure: LAPAROSCOPIC CHOLECYSTECTOMY;  Surgeon: Lujean Sake, MD;  Location: [facility];  Service: General;  Laterality: N/A;   COLONOSCOPY     ENDOMETRIAL BIOPSY  10/23/2022   negative for malignancy   TONSILLECTOMY  1989   WISDOM TOOTH EXTRACTION      Current Outpatient Medications  Medication Instructions   Cholecalciferol (VITAMIN D3 PO) 10,000 Units, Every morning   lisinopril  (ZESTRIL ) 20 mg, Oral, Daily   Menthol, Topical Analgesic, 2 % GEL 1 Application, As needed   Ocrelizumab (OCREVUS IV) 1 Dose, Every 6 months   pantoprazole  (PROTONIX ) 40 mg, Oral, Daily before breakfast   Probiotic Product (PROBIOTIC PO) 1 capsule, Every morning   Turmeric, Curcuma Longa, (CURCUMIN) POWD 750 mg, Daily        Objective:   Physical Exam BP 132/62   Pulse 78   Temp 98.1 F (36.7 C) (Oral)   Resp 16   Ht 5\' 6"  (1.676 m)   Wt 189 lb 2 oz (85.8 kg)   LMP 06/30/2018 (Approximate)   SpO2 95%   BMI 30.53 kg/m  General:   Well developed, NAD, BMI noted. HEENT:  Normocephalic . Face symmetric, atraumatic Skin: Not pale. Not jaundice Neurologic:  alert & oriented X3.  Speech normal, gait appropriate for age and unassisted Psych--  Cognition and judgment appear intact.  Cooperative with normal attention span and concentration.  Behavior appropriate. No anxious or depressed appearing.      Assessment    Assessment Hyperglycemia Multiple sclerosis: neuro @ [facility].  DX 2003 Essential tremor HTN Pulmonary nodule, last CT 05-2015, stable, no further scans Gestational diabetes Proteinuria: Mild, chronic Menopausal ~ 2021 Contraception: Husband vasectomy  PLAN: Weight loss: Since LOV, she learned she had fatty liver per US , has completely changed her diet, it is vegan, much healthier, she is exercising daily, has lost more than 20 pounds, she thinks her new lifestyle is sustainable and feels great. Praised! HTN: BP dropped to the 100s, self decrease lisinopril  to 10 mg daily, now BPs are in the 120s.  Continue monitoring, stop lisinopril  if necessary. Hyperglycemia, last A1c 6.0.  Check A1c  and FLP now that she has weight loss  Fatty liver:  per abdominal US , previous LFTs reviewed, she has an excellent prognosis.  Will recheck LFTs. Hemorrhoids: Long history of, would like to explore treatment, they are external, palpable per pt , bleed on and off.  Plan: Refer to GI. Preventive care: Due for MMG, order sent. RTC CPX 03-2024

## 2023-07-02 NOTE — Patient Instructions (Addendum)
 We are referring you to gastroenterology for hemorrhoid management. You can reach them at [phone]  GO TO THE LAB :  Get the blood work   Your results will be posted on MyChart with my comments  Next office visit for a physical exam by 03-2024 Please make an appointment before you leave today

## 2023-07-02 NOTE — Assessment & Plan Note (Signed)
 Weight loss: Since LOV, she learned she had fatty liver per US , has completely changed her diet, it is vegan, much healthier, she is exercising daily, has lost more than 20 pounds, she thinks her new lifestyle is sustainable and feels great. Praised! HTN: BP dropped to the 100s, self decrease lisinopril  to 10 mg daily, now BPs are in the 120s.  Continue monitoring, stop lisinopril  if necessary. Hyperglycemia, last A1c 6.0.  Check A1c and FLP now that she has weight loss  Fatty liver:  per abdominal US , previous LFTs reviewed, she has an excellent prognosis.  Will recheck LFTs. Hemorrhoids: Long history of, would like to explore treatment, they are external, palpable per pt , bleed on and off.  Plan: Refer to GI. Preventive care: Due for MMG, order sent. RTC CPX 03-2024

## 2023-07-04 ENCOUNTER — Ambulatory Visit: Payer: Self-pay | Admitting: Internal Medicine

## 2023-07-18 ENCOUNTER — Ambulatory Visit
Admission: RE | Admit: 2023-07-18 | Discharge: 2023-07-18 | Disposition: A | Discharge status: Home / Self Care | Source: Ambulatory Visit | Attending: Internal Medicine | Admitting: Internal Medicine

## 2023-07-30 ENCOUNTER — Ambulatory Visit: Admitting: Nurse Practitioner

## 2023-10-20 ENCOUNTER — Other Ambulatory Visit: Payer: Self-pay | Admitting: Internal Medicine

## 2024-01-01 ENCOUNTER — Other Ambulatory Visit: Payer: Self-pay

## 2024-01-01 ENCOUNTER — Encounter (HOSPITAL_COMMUNITY): Payer: Self-pay

## 2024-01-01 ENCOUNTER — Emergency Department (HOSPITAL_COMMUNITY)
Admission: EM | Admit: 2024-01-01 | Discharge: 2024-01-01 | Disposition: A | Discharge status: Home / Self Care | Attending: Emergency Medicine | Admitting: Emergency Medicine

## 2024-01-01 ENCOUNTER — Emergency Department (HOSPITAL_COMMUNITY)

## 2024-01-01 DIAGNOSIS — M25532 Pain in left wrist: Secondary | ICD-10-CM | POA: Diagnosis not present

## 2024-01-01 DIAGNOSIS — S32591A Other specified fracture of right pubis, initial encounter for closed fracture: Secondary | ICD-10-CM | POA: Insufficient documentation

## 2024-01-01 DIAGNOSIS — W001XXA Fall from stairs and steps due to ice and snow, initial encounter: Secondary | ICD-10-CM | POA: Diagnosis not present

## 2024-01-01 MED ORDER — FAMOTIDINE 20 MG PO TABS: 20.0000 mg | ORAL_TABLET | Freq: Every day | ORAL | 0 refills | Status: AC

## 2024-01-01 MED ORDER — ACETAMINOPHEN 500 MG PO TABS
1000.0000 mg | ORAL_TABLET | Freq: Once | ORAL | Status: AC
  Administered 2024-01-01: 1000 mg via ORAL
  Filled 2024-01-01: qty 2

## 2024-01-01 MED ORDER — KETOROLAC TROMETHAMINE 10 MG PO TABS: 10.0000 mg | ORAL_TABLET | Freq: Four times a day (QID) | ORAL | 0 refills | Status: DC | PRN

## 2024-01-01 NOTE — Discharge Instructions (Addendum)
 Your Xray of your hip showed a small fracture of your pelvis.  This should heal fine on its own without any treatment.  We consider this injury weightbearing as tolerated, meaning if you are able to do activities and it does not cause the pain, it is safe to do so.  Like we discussed, there is no an obvious fracture in your wrist, with your pain, there may be one at a later date when there is repeat x-ray.  We have placed you in a splint.  You may take Toradol , 10 mg every 6 hours over the next 5 days.  Do not take any other NSAIDs like Motrin, Aleve or ibuprofen.  You may take Tylenol .  I have also sent you some famotidine , which is a medicine that can help protect your stomach while you are taking this medicine.  Included in your discharge paperwork is a telephone number for an orthopedic doctor.  Please give them a call this week for follow-up appointment.

## 2024-01-01 NOTE — Progress Notes (Signed)
 Orthopedic Tech Progress Note Patient Details:  LUNNA VOGELGESANG [birth year] [id number]  Ortho Devices Type of Ortho Device: Velcro wrist splint Ortho Device/Splint Location: left Ortho Device/Splint Interventions: Ordered, Application, Adjustment   Post Interventions Patient Tolerated: Well Instructions Provided: Adjustment of device, Care of device  Waylan Thom Loving 01/01/2024, 9:47 AM

## 2024-01-01 NOTE — ED Triage Notes (Signed)
 Pt. Slipped & fell this morning on [place] stairs. States she did not hit her head; left wrist is very swollen. Right arm is sore. Right hip is swollen, tender & painful to touch. Pt is not on blood thinners. Took 600mg  ibuprofen this morning.

## 2024-01-01 NOTE — ED Provider Notes (Signed)
  EMERGENCY DEPARTMENT AT [facility] Provider Note   CSN: [id number] Arrival date & time: 01/01/24  9167     Patient presents with: Arm Injury, Hip Pain, and Hip Injury   Maria Graves is a 53 y.o. female.   This is a pleasant 53 year old female with a history of multiple sclerosis who is here today after she slipped on some icy stairs this morning when she was trying to walk her dog.  She fell onto her right side and her left wrist.  She did not strike her head, she is not on blood thinners.   Arm Injury Hip Pain       Prior to Admission medications   Medication Sig Start Date End Date Taking? Authorizing Provider  famotidine  (PEPCID ) 20 MG tablet Take 1 tablet (20 mg total) by mouth daily. 01/01/24  Yes Mannie Pac T, DO  ketorolac  (TORADOL ) 10 MG tablet Take 1 tablet (10 mg total) by mouth every 6 (six) hours as needed. 01/01/24  Yes Mannie Pac T, DO  Cholecalciferol (VITAMIN D3 PO) Take 10,000 Units by mouth in the morning.    [provider]  lisinopril  (ZESTRIL ) 10 MG tablet Take 1 tablet (10 mg total) by mouth daily. 10/21/23   Paz, Jose E, MD  Menthol, Topical Analgesic, 2 % GEL Apply 1 Application topically as needed (pain.). Blue Stop Max Muscle & Joint Relief Gel    [provider]  Ocrelizumab (OCREVUS IV) Inject 1 Dose into the vein every 6 (six) months.    [provider]  Probiotic Product (PROBIOTIC PO) Take 1 capsule by mouth in the morning.    [provider]    Allergies: Shellfish allergy, Phenazopyridine, Erythromycin base, Grapefruit extract, Sulfa antibiotics, Tetanus toxoid-containing vaccines, Macrobid  [nitrofurantoin  macrocrystal], and Pineapple    Review of Systems  Updated Vital Signs BP (!) 157/79 (BP Location: Right Arm)   Pulse 65   Temp (!) 97.5 F (36.4 C) (Oral)   Resp 16   Ht 5' 6 (1.676 m)   Wt 85.8 kg   LMP 06/30/2018 (Approximate)   SpO2 100%   BMI 30.53 kg/m    Physical Exam Vitals and nursing note reviewed.  HENT:     Head: Normocephalic.  Eyes:     Pupils: Pupils are equal, round, and reactive to light.  Cardiovascular:     Rate and Rhythm: Normal rate.  Pulmonary:     Effort: Pulmonary effort is normal.  Abdominal:     General: Abdomen is flat.  Musculoskeletal:     Cervical back: Normal range of motion.     Comments: Tenderness over the left wrist.  No deformity.  Contusion on the right hip.  Patient able to ambulate, bear weight.  No deformity or shortening.  No other tenderness over the neck, chest wall, lower extremities     (all labs ordered are listed, but only abnormal results are displayed) Labs Reviewed - No data to display  EKG: None  Radiology: DG Wrist Complete Left Result Date: 01/01/2024 CLINICAL DATA:  Fall. EXAM: LEFT WRIST - COMPLETE 3+ VIEW COMPARISON:  None Available. FINDINGS: There is no evidence of acute fracture or dislocation. 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 Soft tissues are unremarkable. IMPRESSION: No acute findings. 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 Electronically Signed   By: Marcey Moan M.D.   On: 01/01/2024 09:18   DG Hip Unilat W or Wo Pelvis 2-3 Views Right Result Date: 01/01/2024 CLINICAL DATA:  Fall.  EXAM: DG HIP (WITH OR WITHOUT PELVIS) 2-3V RIGHT COMPARISON:  None Available. FINDINGS: No right-sided hip fracture or dislocation. There may be a subtle fracture of the right superior pubic ramus. This appears nondisplaced. The pubic symphysis demonstrates normal alignment. No bony lesions or destruction. IMPRESSION: Possible subtle nondisplaced fracture of the right superior pubic ramus. Electronically Signed   By: Marcey Moan M.D.   On: 01/01/2024 09:18     Procedures   Medications Ordered in the ED  acetaminophen  (TYLENOL ) tablet 1,000 mg (1,000 mg Oral Given 01/01/24 0925)                                    Medical Decision Making 53 year old female here today after  a slip and fall.  Plan-will obtain plain films of the patient's wrist, hip.  No head strike.  No other traumatic injuries identified on exam.  Had a discussion with the patient and her husband at bedside about her MS.  Appears to be doing quite well, has been in remission.  Recently had an MRI that she is awaiting the results on.  Reviewed the patient's most recent neurology office note.  Reassessment 9:40 AM-  Amount and/or Complexity of Data Reviewed Radiology: ordered.  Risk OTC drugs.        Final diagnoses:  Closed fracture of ramus of right pubis, initial encounter (HCC)  Left wrist pain    ED Discharge Orders          Ordered    ketorolac  (TORADOL ) 10 MG tablet  Every 6 hours PRN        01/01/24 0944    famotidine  (PEPCID ) 20 MG tablet  Daily        01/01/24 0944               Mannie Pac T, DO 01/01/24 [phone]

## 2024-01-07 DIAGNOSIS — S52509A Unspecified fracture of the lower end of unspecified radius, initial encounter for closed fracture: Secondary | ICD-10-CM | POA: Insufficient documentation

## 2024-03-04 ENCOUNTER — Ambulatory Visit: Payer: 59 | Admitting: Internal Medicine

## 2024-03-04 ENCOUNTER — Encounter: Payer: Self-pay | Admitting: Internal Medicine

## 2024-03-04 VITALS — BP 128/70 | HR 78 | Temp 98.0°F | Resp 16 | Ht 66.0 in | Wt 189.1 lb

## 2024-03-04 DIAGNOSIS — I10 Essential (primary) hypertension: Secondary | ICD-10-CM

## 2024-03-04 DIAGNOSIS — Z0001 Encounter for general adult medical examination with abnormal findings: Secondary | ICD-10-CM

## 2024-03-04 DIAGNOSIS — Z789 Other specified health status: Secondary | ICD-10-CM | POA: Insufficient documentation

## 2024-03-04 DIAGNOSIS — S32591D Other specified fracture of right pubis, subsequent encounter for fracture with routine healing: Secondary | ICD-10-CM

## 2024-03-04 DIAGNOSIS — G35D Multiple sclerosis, unspecified: Secondary | ICD-10-CM

## 2024-03-04 DIAGNOSIS — E539 Vitamin B deficiency, unspecified: Secondary | ICD-10-CM

## 2024-03-04 DIAGNOSIS — R109 Unspecified abdominal pain: Secondary | ICD-10-CM

## 2024-03-04 DIAGNOSIS — S52502D Unspecified fracture of the lower end of left radius, subsequent encounter for closed fracture with routine healing: Secondary | ICD-10-CM

## 2024-03-04 DIAGNOSIS — E559 Vitamin D deficiency, unspecified: Secondary | ICD-10-CM

## 2024-03-04 DIAGNOSIS — Z0000 Encounter for general adult medical examination without abnormal findings: Secondary | ICD-10-CM

## 2024-03-04 LAB — CBC WITH DIFFERENTIAL/PLATELET
Basophils Absolute: 0.1 10*3/uL (ref 0.0–0.1)
Basophils Relative: 1.3 % (ref 0.0–3.0)
Eosinophils Absolute: 0.2 10*3/uL (ref 0.0–0.7)
Eosinophils Relative: 3.2 % (ref 0.0–5.0)
HCT: 43.5 % (ref 36.0–46.0)
Hemoglobin: 14.5 g/dL (ref 12.0–15.0)
Lymphocytes Relative: 27.9 % (ref 12.0–46.0)
Lymphs Abs: 2 10*3/uL (ref 0.7–4.0)
MCHC: 33.3 g/dL (ref 30.0–36.0)
MCV: 86.3 fl (ref 78.0–100.0)
Monocytes Absolute: 0.5 10*3/uL (ref 0.1–1.0)
Monocytes Relative: 6.8 % (ref 3.0–12.0)
Neutro Abs: 4.3 10*3/uL (ref 1.4–7.7)
Neutrophils Relative %: 60.8 % (ref 43.0–77.0)
Platelets: 257 10*3/uL (ref 150.0–400.0)
RBC: 5.03 Mil/uL (ref 3.87–5.11)
RDW: 12.5 % (ref 11.5–15.5)
WBC: 7.1 10*3/uL (ref 4.0–10.5)

## 2024-03-04 LAB — URINALYSIS, ROUTINE W REFLEX MICROSCOPIC
Bilirubin Urine: NEGATIVE
Hgb urine dipstick: NEGATIVE
Ketones, ur: NEGATIVE
Nitrite: NEGATIVE
Specific Gravity, Urine: 1.02 (ref 1.000–1.030)
Total Protein, Urine: NEGATIVE
Urine Glucose: NEGATIVE
Urobilinogen, UA: 0.2 (ref 0.0–1.0)
pH: 6 (ref 5.0–8.0)

## 2024-03-04 LAB — COMPREHENSIVE METABOLIC PANEL WITH GFR
ALT: 17 U/L (ref 3–35)
AST: 14 U/L (ref 5–37)
Albumin: 4.5 g/dL (ref 3.5–5.2)
Alkaline Phosphatase: 74 U/L (ref 39–117)
BUN: 9 mg/dL (ref 6–23)
CO2: 30 meq/L (ref 19–32)
Calcium: 9.8 mg/dL (ref 8.4–10.5)
Chloride: 102 meq/L (ref 96–112)
Creatinine, Ser: 0.56 mg/dL (ref 0.40–1.20)
GFR: 102.37 mL/min
Glucose, Bld: 87 mg/dL (ref 70–99)
Potassium: 4.9 meq/L (ref 3.5–5.1)
Sodium: 140 meq/L (ref 135–145)
Total Bilirubin: 0.5 mg/dL (ref 0.2–1.2)
Total Protein: 6.8 g/dL (ref 6.0–8.3)

## 2024-03-04 LAB — IBC + FERRITIN
Ferritin: 31.2 ng/mL (ref 10.0–291.0)
Iron: 110 ug/dL (ref 42–145)
Saturation Ratios: 25.2 % (ref 20.0–50.0)
TIBC: 436.8 ug/dL (ref 250.0–450.0)
Transferrin: 312 mg/dL (ref 212.0–360.0)

## 2024-03-04 LAB — VITAMIN D 25 HYDROXY (VIT D DEFICIENCY, FRACTURES): VITD: 55.7 ng/mL (ref 30.00–100.00)

## 2024-03-04 LAB — B12 AND FOLATE PANEL
Folate: >23.4 ng/mL
Vitamin B-12: 1078 pg/mL — ABNORMAL HIGH (ref 211–911)

## 2024-03-04 LAB — TSH: TSH: 1.47 u[IU]/mL (ref 0.35–5.50)

## 2024-03-04 NOTE — Patient Instructions (Signed)
 Please read your instructions carefully.   GO TO THE LAB :  Get the blood work    Go to the front desk for the checkout Please make an appointment for a checkup in 4 months   Will arrange for a bone density test Will arrange for an abdominal ultrasound Call if the abdominal pain significantly increases or changes  Check the  blood pressure regularly Blood pressure goal:  between 110/65 and  130/80. If it is consistently higher or lower, let me know    RIGHT SIDED ABDOMINAL PAIN: You have been experiencing intermittent right-sided abdominal pain, possibly related to your liver cyst or other pathology. -We have ordered an abdominal ultrasound and a urine analysis and culture to investigate further. -Monitor your symptoms and we will re-evaluate in 4 months if there is no worsening.  MULTIPLE SCLEROSIS: Your multiple sclerosis is being managed with Ocrevus and regular neurology follow-ups. -Continue taking Ocrevus as prescribed and maintain your regular neurology follow-ups.  PRIMARY HYPERTENSION: Your hypertension is controlled with lisinopril  and your blood pressure readings are stable. -Continue taking lisinopril  10 mg daily.  VITAMIN D  DEFICIENCY: Your vitamin D  deficiency is being managed with supplements. -Continue taking your vitamin D  supplements as directed.  VITAMIN B12 DEFICIENCY: Your previous low B12 levels are likely due to your vegan diet. -We have ordered a B12 level test. -Continue taking your over-the-counter B12 supplements.  CHRONIC HEMORRHOIDS: Your hemorrhoids continue to cause discomfort. -We discussed rubber band ligation as a non-surgical option. -Continue to monitor your symptoms and consider a gastroenterology referral if symptoms worsen.  FATTY LIVER: Your fatty liver is likely related to dietary fat intake. -Continue to reduce fat intake and maintain a healthy diet.  CLOSED FRACTURES OF PELVIS AND WRIST: Your fractures are healing well without  significant pain. -Continue current management and monitor healing progress.

## 2024-03-04 NOTE — Progress Notes (Unsigned)
 "  Subjective:    Patient ID: Maria Graves, female    DOB: Sep 01, 1970, 54 y.o.   MRN: [id number]  DOS:  03/04/2024 CPX  Discussed the use of AI scribe software for clinical note transcription with the patient, who gave verbal consent to proceed.  History of Present Illness She experienced a fall on December 3rd with closed fractures of the pelvis and wrist confirmed on follow-up imaging. No surgery was required. She declined a cast for the wrist. Pain is currently manageable.  For the past three to four months she has had intermittent right-sided abdominal pain, sometimes upper and sometimes lower, described as variably sharp or achy. She has occasional diarrhea and loose stools since starting a vegan diet. She denies blood in stool. She notes dark urine despite good hydration. She had her gallbladder removed and has been told she has fatty liver and a liver cyst.  She has symptomatic hemorrhoids that continue to cause discomfort. She has not completed a recommended gastroenterology evaluation .  She follows a vegan diet and previously had critically low B12 levels after starting this diet. She takes over-the-counter B12 but has not had repeat testing. She takes lisinopril  and Ocrevus and uses vitamin D  supplements.     Wt Readings from Last 3 Encounters:  03/04/24 189 lb 2 oz (85.8 kg)  01/01/24 189 lb 2.5 oz (85.8 kg)  07/02/23 189 lb 2 oz (85.8 kg)   Review of Systems  Other than above, a 14 point review of systems is negative     Past Medical History:  Diagnosis Date   Arthritis    bilateral hands   Contraception    husband vasectomy   Essential tremor    Family history of adverse reaction to anesthesia    Mother heart stop twice when she was under anesthesia.   Gestational diabetes    Goiter, nodular    HTN (hypertension)    on meds   MS (multiple sclerosis)    Ovarian cyst 10/23/2022   negative for malignancy   Pneumonia    Proteinuria    Reports mild, chronic  proteinuria    Past Surgical History:  Procedure Laterality Date   CHOLECYSTECTOMY N/A 03/18/2023   Procedure: LAPAROSCOPIC CHOLECYSTECTOMY;  Surgeon: Dasie Leonor LITTIE, MD;  Location: [facility];  Service: General;  Laterality: N/A;   COLONOSCOPY     ENDOMETRIAL BIOPSY  10/23/2022   negative for malignancy   TONSILLECTOMY  1989   WISDOM TOOTH EXTRACTION      Current Outpatient Medications  Medication Instructions   Cholecalciferol (VITAMIN D3 PO) 10,000 Units, Every morning   famotidine  (PEPCID ) 20 mg, Oral, Daily   lisinopril  (ZESTRIL ) 10 mg, Oral, Daily   Menthol, Topical Analgesic, 2 % GEL 1 Application, As needed   Ocrelizumab (OCREVUS IV) 1 Dose, Every 6 months   Probiotic Product (PROBIOTIC PO) 1 capsule, Every morning       Objective:   Physical Exam BP 128/70   Pulse 78   Temp 98 F (36.7 C) (Oral)   Resp 16   Ht 5' 6 (1.676 m)   Wt 189 lb 2 oz (85.8 kg)   LMP 06/30/2018   SpO2 98%   BMI 30.53 kg/m  General: Well developed, NAD, BMI noted Neck: No  thyromegaly  HEENT:  Normocephalic . Face symmetric, atraumatic Lungs:  CTA B Normal respiratory effort, no intercostal retractions, no accessory muscle use. Heart: RRR,  no murmur.  Abdomen:  Not distended, soft, minimal tenderness without  mass or rebound of the right upper and right lower abdomen. Lower extremities: no pretibial edema bilaterally  Skin: Exposed areas without rash. Not pale. Not jaundice Neurologic:  alert & oriented X3.  Speech normal, gait appropriate for age and unassisted Strength symmetric and appropriate for age.  Psych: Cognition and judgment appear intact.  Cooperative with normal attention span and concentration.  Behavior appropriate. No anxious or depressed appearing.     Assessment   Assessment Hyperglycemia Multiple sclerosis: neuro @ [facility].  DX 2003 Essential tremor HTN Pulmonary nodule, last CT 05-2015, stable, no further scans Gestational diabetes Proteinuria: Mild,  chronic Menopausal ~ 2021 B12 deficiency, Dx 2025 after a vegan diet  Contraception: Husband vasectomy   Assessment & Plan Here for CPX -Td: intolerant, ++ swelling - PNM, Shingrix, COVID, flu shot: Hesitant to proceed due to history of MS --Female care:  sees  Gyn.  Pap smear 06-2020.  MMG: 01/2024 ([facility]) -- CCS: cologuard - 01/06/2019; subsequently there was a question of colon cancer in mother, had a colonoscopy 05/03/2022: 1 hyperplastic polyp, internal hemorrhoids, 10 years per GI letter . -- Bones : menopausal sine 2021, on Vit D ; FH osteoporosis: G-mother.  Pt recent had 2 fractures after a fall.-- RX DEXA -Labs: B1 B12 vitamin D  CMP CBC anemia panel TSH --Tobacco: Quit tobacco 09-2022. Praised!! --lifestyle: Trying to go back on a routine exercise.   Other issues   R sided abdominal pain Intermittent pain, mild, other upper or lower abdomen, history of cholecystectomy and fatty liver.  She also had a liver cyst on previous ultrasound.  Symptoms are somewhat atypical with no red flags. Plan: Ultrasound, UA urine culture, general labs.  Reassess in 4 months, call sooner if symptoms increase Multiple sclerosis Managed with Ocrevus and regular neurology follow-up.   HTN Hypertension is controlled with lisinopril . Blood pressure readings are stable.   Fall, subsequent encounter, went to the ER 01/01/2024 after a fall, possible nondisplaced fracture of the R superior pubic ramus and  L wrist .  Subsequently saw Ortho, they confirmed  closed fractures of pelvis and wrist (per pt), on conservative treatment. Vitamin D , B12 deficiencies.  Vegan diet Managed with supplementation and compliance is noted. Continue vitamin D  supplementation. Checking B12, B1, vitamin D  iron levels Hemorrhoids: Seen last visit, was referred to GI but did not pursue. Discussed rubber band ligation as a non-surgical option. Continue to monitor symptoms and GI referral when pt is ready RTC 4 months         "

## 2024-03-05 ENCOUNTER — Ambulatory Visit (HOSPITAL_BASED_OUTPATIENT_CLINIC_OR_DEPARTMENT_OTHER): Admission: RE | Admit: 2024-03-05

## 2024-03-05 ENCOUNTER — Encounter: Payer: Self-pay | Admitting: Internal Medicine

## 2024-03-05 LAB — URINE CULTURE
MICRO NUMBER:: 17547955
SPECIMEN QUALITY:: ADEQUATE

## 2024-03-05 NOTE — Assessment & Plan Note (Signed)
 Here for CPX -Td: intolerant, ++ swelling - PNM, Shingrix, COVID, flu shot: Hesitant to proceed due to history of MS --Female care:  sees  Gyn.  Pap smear 06-2020.  MMG: 01/2024 ([facility]) -- CCS: cologuard - 01/06/2019; subsequently there was a question of colon cancer in mother, had a colonoscopy 05/03/2022: 1 hyperplastic polyp, internal hemorrhoids, 10 years per GI letter . -- Bones : menopausal sine 2021, on Vit D ; FH osteoporosis: G-mother.  Pt recent had 2 fractures after a fall.-- RX DEXA -Labs: B1 B12 vitamin D  CMP CBC anemia panel TSH --Tobacco: Quit tobacco 09-2022. Praised!! --lifestyle: Trying to go back on a routine exercise.

## 2024-03-05 NOTE — Assessment & Plan Note (Signed)
 Here for CPX Other issues   R sided abdominal pain Intermittent pain, mild, other upper or lower abdomen, history of cholecystectomy and fatty liver.  She also had a liver cyst on previous ultrasound.  Symptoms are somewhat atypical with no red flags. Plan: Ultrasound, UA urine culture, general labs.  Reassess in 4 months, call sooner if symptoms increase Multiple sclerosis Managed with Ocrevus and regular neurology follow-up.   HTN Hypertension is controlled with lisinopril . Blood pressure readings are stable.   Fall, subsequent encounter, went to the ER 01/01/2024 after a fall, possible nondisplaced fracture of the R superior pubic ramus and  L wrist .  Subsequently saw Ortho, they confirmed  closed fractures of pelvis and wrist (per pt), on conservative treatment. Vitamin D , B12 deficiencies.  Vegan diet Managed with supplementation and compliance is noted. Continue vitamin D  supplementation. Checking B12, B1, vitamin D  iron levels Hemorrhoids: Seen last visit, was referred to GI but did not pursue. Discussed rubber band ligation as a non-surgical option. Continue to monitor symptoms and GI referral when pt is ready RTC 4 months

## 2024-03-06 ENCOUNTER — Ambulatory Visit: Payer: Self-pay | Admitting: Internal Medicine

## 2024-03-09 ENCOUNTER — Encounter: Admitting: Internal Medicine

## 2024-03-31 ENCOUNTER — Other Ambulatory Visit (HOSPITAL_BASED_OUTPATIENT_CLINIC_OR_DEPARTMENT_OTHER)

## 2024-04-02 ENCOUNTER — Other Ambulatory Visit (HOSPITAL_BASED_OUTPATIENT_CLINIC_OR_DEPARTMENT_OTHER)

## 2024-07-03 ENCOUNTER — Ambulatory Visit: Admitting: Internal Medicine
